# Patient Record
Sex: Female | Born: 1990 | Race: Black or African American | Hispanic: No | Marital: Single | Smoking: Never smoker
Health system: Southern US, Community
[De-identification: ages and names within clinical notes are randomized; demographics above are authoritative.]

## PROBLEM LIST (undated history)

## (undated) DIAGNOSIS — Z789 Other specified health status: Secondary | ICD-10-CM

---
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
## 2010-04-20 ENCOUNTER — Inpatient Hospital Stay (HOSPITAL_COMMUNITY): Admission: AD | Admit: 2010-04-20 | Discharge: 2010-04-24 | Payer: Self-pay | Admitting: Obstetrics and Gynecology

## 2010-04-21 ENCOUNTER — Encounter (INDEPENDENT_AMBULATORY_CARE_PROVIDER_SITE_OTHER): Payer: Self-pay | Admitting: Obstetrics and Gynecology

## 2010-11-22 LAB — CBC
HCT: 24.2 % — ABNORMAL LOW (ref 36.0–46.0)
MCH: 28.6 pg (ref 26.0–34.0)
MCHC: 34.4 g/dL (ref 30.0–36.0)
MCHC: 33.1 g/dL (ref 30.0–36.0)
MCV: 86 fL (ref 78.0–100.0)
Platelets: 189 10*3/uL (ref 150–400)
Platelets: 232 10*3/uL (ref 150–400)
RDW: 15.4 % (ref 11.5–15.5)

## 2010-11-22 LAB — RPR: RPR Ser Ql: NONREACTIVE

## 2010-11-27 LAB — URINALYSIS, ROUTINE W REFLEX MICROSCOPIC
Nitrite: NEGATIVE
Specific Gravity, Urine: >1.03 — ABNORMAL HIGH (ref 1.005–1.030)
Urobilinogen, UA: 0.2 mg/dL (ref 0.0–1.0)

## 2010-11-27 LAB — CBC
Platelets: 257 10*3/uL (ref 150–400)
RDW: 14.9 % (ref 11.5–15.5)

## 2014-03-20 ENCOUNTER — Encounter (HOSPITAL_COMMUNITY): Payer: Self-pay | Admitting: *Deleted

## 2014-03-20 ENCOUNTER — Inpatient Hospital Stay (HOSPITAL_COMMUNITY)
Admission: AD | Admit: 2014-03-20 | Discharge: 2014-03-20 | Disposition: A | Discharge status: Home / Self Care | Payer: Self-pay | Source: Ambulatory Visit | Attending: Obstetrics & Gynecology | Admitting: Obstetrics & Gynecology

## 2014-03-20 DIAGNOSIS — N909 Noninflammatory disorder of vulva and perineum, unspecified: Secondary | ICD-10-CM | POA: Insufficient documentation

## 2014-03-20 DIAGNOSIS — A5901 Trichomonal vulvovaginitis: Secondary | ICD-10-CM | POA: Insufficient documentation

## 2014-03-20 DIAGNOSIS — Z87891 Personal history of nicotine dependence: Secondary | ICD-10-CM | POA: Insufficient documentation

## 2014-03-20 DIAGNOSIS — Z202 Contact with and (suspected) exposure to infections with a predominantly sexual mode of transmission: Secondary | ICD-10-CM | POA: Insufficient documentation

## 2014-03-20 LAB — HERPES SIMPLEX VIRUS(HSV) DNA BY PCR
HSV 1 DNA: NOT DETECTED
HSV 2 DNA: DETECTED

## 2014-03-20 LAB — URINALYSIS, ROUTINE W REFLEX MICROSCOPIC
BILIRUBIN URINE: NEGATIVE
GLUCOSE, UA: NEGATIVE mg/dL
Hgb urine dipstick: NEGATIVE
KETONES UR: NEGATIVE mg/dL
NITRITE: NEGATIVE
PH: 6 (ref 5.0–8.0)
Protein, ur: NEGATIVE mg/dL
Urobilinogen, UA: 0.2 mg/dL (ref 0.0–1.0)

## 2014-03-20 LAB — URINE MICROSCOPIC-ADD ON

## 2014-03-20 LAB — WET PREP, GENITAL: YEAST WET PREP: NONE SEEN

## 2014-03-20 LAB — POCT PREGNANCY, URINE: Preg Test, Ur: NEGATIVE

## 2014-03-20 LAB — GC/CHLAMYDIA PROBE AMP
CT PROBE, AMP APTIMA: NEGATIVE
GC PROBE AMP APTIMA: NEGATIVE

## 2014-03-20 MED ORDER — METRONIDAZOLE 500 MG PO TABS
2000.0000 mg | ORAL_TABLET | Freq: Once | ORAL | Status: AC
  Administered 2014-03-20: 2000 mg via ORAL
  Filled 2014-03-20: qty 4

## 2014-03-20 MED ORDER — CEFTRIAXONE SODIUM 250 MG IJ SOLR
250.0000 mg | Freq: Once | INTRAMUSCULAR | Status: AC
  Administered 2014-03-20: 250 mg via INTRAMUSCULAR
  Filled 2014-03-20: qty 250

## 2014-03-20 MED ORDER — AZITHROMYCIN 250 MG PO TABS
1000.0000 mg | ORAL_TABLET | Freq: Once | ORAL | Status: AC
  Administered 2014-03-20: 1000 mg via ORAL
  Filled 2014-03-20: qty 4

## 2014-03-20 NOTE — Discharge Instructions (Signed)
Trichomoniasis Trichomoniasis is an infection caused by an organism called Trichomonas. The infection can affect both women and men. In women, the outer female genitalia and the vagina are affected. In men, the penis is mainly affected, but the prostate and other reproductive organs can also be involved. Trichomoniasis is a sexually transmitted infection (STI) and is most often passed to another person through sexual contact.  RISK FACTORS  Having unprotected sexual intercourse.  Having sexual intercourse with an infected partner. SIGNS AND SYMPTOMS  Symptoms of trichomoniasis in women include:  Abnormal gray-green frothy vaginal discharge.  Itching and irritation of the vagina.  Itching and irritation of the area outside the vagina. Symptoms of trichomoniasis in men include:   Penile discharge with or without pain.  Pain during urination. This results from inflammation of the urethra. DIAGNOSIS  Trichomoniasis may be found during a Pap test or physical exam. Your health care provider may use one of the following methods to help diagnose this infection:  Examining vaginal discharge under a microscope. For men, urethral discharge would be examined.  Testing the pH of the vagina with a test tape.  Using a vaginal swab test that checks for the Trichomonas organism. A test is available that provides results within a few minutes.  Doing a culture test for the organism. This is not usually needed. TREATMENT   You may be given medicine to fight the infection. Women should inform their health care provider if they could be or are pregnant. Some medicines used to treat the infection should not be taken during pregnancy.  Your health care provider may recommend over-the-counter medicines or creams to decrease itching or irritation.  Your sexual partner will need to be treated if infected. HOME CARE INSTRUCTIONS   Take all medicine prescribed by your health care provider.  Take  over-the-counter medicine for itching or irritation as directed by your health care provider.  Do not have sexual intercourse while you have the infection.  Women should not douche or wear tampons while they have the infection.  Discuss your infection with your partner. Your partner may have gotten the infection from you, or you may have gotten it from your partner.  Have your sex partner get examined and treated if necessary.  Practice safe, informed, and protected sex.  See your health care provider for other STI testing. SEEK MEDICAL CARE IF:   You still have symptoms after you finish your medicine.  You develop abdominal pain.  You have pain when you urinate.  You have bleeding after sexual intercourse.  You develop a rash.  Your medicine makes you sick or makes you throw up (vomit). Document Released: 02/18/2001 Document Revised: 08/30/2013 Document Reviewed: 06/06/2013 [facility] Patient Information 2015 [place], [place]. This information is not intended to replace advice given to you by your health care provider. Make sure you discuss any questions you have with your health care provider.  Herpes Simplex Herpes simplex is generally classified as Type 1 or Type 2. Type 1 is generally the type that is responsible for cold sores. Type 2 is generally associated with sexually transmitted diseases. We now know that most of the thoughts on these viruses are inaccurate. We find that HSV1 is also present genitally and HSV2 can be present orally, but this will vary in different locations of the world. Herpes simplex is usually detected by doing a culture. Blood tests are also available for this virus; however, the accuracy is often not as good.  PREPARATION FOR TEST No  preparation or fasting is necessary. NORMAL FINDINGS  No virus present  No HSV antigens or antibodies present Ranges for normal findings may vary among different laboratories and hospitals. You should always check with  your doctor after having lab work or other tests done to discuss the meaning of your test results and whether your values are considered within normal limits. MEANING OF TEST  Your caregiver will go over the test results with you and discuss the importance and meaning of your results, as well as treatment options and the need for additional tests if necessary. OBTAINING THE TEST RESULTS  It is your responsibility to obtain your test results. Ask the lab or department performing the test when and how you will get your results. Document Released: 09/27/2004 Document Revised: 11/17/2011 Document Reviewed: 08/05/2008 [facility] Patient Information 2015 [place], [place]. This information is not intended to replace advice given to you by your health care provider. Make sure you discuss any questions you have with your health care provider.

## 2014-03-20 NOTE — MAU Note (Signed)
Pt c/o vag swelling and sore bumps on labia for the past 1-2 weeks; also reports yellow-green discharge for the past two weeks. Pt says she was hoping it  Would go away, that's why she waited til now.

## 2014-03-20 NOTE — MAU Provider Note (Signed)
History     CSN: [id number]  Arrival date and time: 03/20/14 0045   None     Chief Complaint  Patient presents with  . Groin Swelling   HPI  Karen Blevins is a 23 y.o. G1P1001 who presents today with sores on her labia, and a green discharge she has had both for about 2 weeks. She states that she has had pain, but it has gotten better.   History reviewed. No pertinent past medical history.  History reviewed. No pertinent past surgical history.  No family history on file.  History  Substance Use Topics  . Smoking status: Not on file  . Smokeless tobacco: Not on file  . Alcohol Use: Not on file    Allergies: Allergies not on file  No prescriptions prior to admission    ROS Physical Exam   Blood pressure 104/60, pulse 82, temperature 98.2 F (36.8 C), temperature source Oral, resp. rate 18, height 5' (1.524 m), weight 52.731 kg (116 lb 4 oz), SpO2 100.00%.  Physical Exam  Nursing note and vitals reviewed. Constitutional: She is oriented to person, place, and time. She appears well-developed and well-nourished. No distress.  Cardiovascular: Normal rate.   Respiratory: Effort normal.  GI: Soft. There is no tenderness. There is no rebound.  Genitourinary:   External: 3 small herpatic appearing lesions. Two that have healed over at this time, and one that has not fully crusted over. Attempted to obtain a HSV culture.  Vagina: moderate amount of thick green, malodorous discharge Cervix: pink, smooth, no CMT Uterus: NSSC Adnexa: NT   Neurological: She is alert and oriented to person, place, and time.  Skin: Skin is warm and dry.  Psychiatric: She has a normal mood and affect.    MAU Course  Procedures  Results for orders placed during the hospital encounter of 03/20/14 (from the past 24 hour(s))  URINALYSIS, ROUTINE W REFLEX MICROSCOPIC     Status: Abnormal   Collection Time    03/20/14 12:55 AM      Result Value Ref Range   Color, Urine YELLOW  YELLOW   APPearance CLEAR  CLEAR   Specific Gravity, Urine >1.030 (*) 1.005 - 1.030   pH 6.0  5.0 - 8.0   Glucose, UA NEGATIVE  NEGATIVE mg/dL   Hgb urine dipstick NEGATIVE  NEGATIVE   Bilirubin Urine NEGATIVE  NEGATIVE   Ketones, ur NEGATIVE  NEGATIVE mg/dL   Protein, ur NEGATIVE  NEGATIVE mg/dL   Urobilinogen, UA 0.2  0.0 - 1.0 mg/dL   Nitrite NEGATIVE  NEGATIVE   Leukocytes, UA SMALL (*) NEGATIVE  URINE MICROSCOPIC-ADD ON     Status: Abnormal   Collection Time    03/20/14 12:55 AM      Result Value Ref Range   Squamous Epithelial / LPF FEW (*) RARE   WBC, UA 7-10  <3 WBC/hpf   RBC / HPF 3-6  <3 RBC/hpf   Bacteria, UA FEW (*) RARE   Crystals CA OXALATE CRYSTALS (*) NEGATIVE   Urine-Other MUCOUS PRESENT    POCT PREGNANCY, URINE     Status: None   Collection Time    03/20/14  1:03 AM      Result Value Ref Range   Preg Test, Ur NEGATIVE  NEGATIVE  WET PREP, GENITAL     Status: Abnormal   Collection Time    03/20/14  1:45 AM      Result Value Ref Range   Yeast Wet Prep HPF POC NONE  SEEN  NONE SEEN   Trich, Wet Prep FEW (*) NONE SEEN   Clue Cells Wet Prep HPF POC FEW (*) NONE SEEN   WBC, Wet Prep HPF POC MODERATE (*) NONE SEEN   0209: D/W the patient, and she desires treatment today for GC/CT and trichomoniasis   Assessment and Plan   1. Trichomonal vaginitis   2. Exposure to STD    Treated here today for Trichomoniasis and GC/CT Discussed partner treatment HSV PCR pending Discussed comfort measures Return to MAU as needed  Follow-up Information   Follow up with [facility] [place] [facility]ERN[facility] ADMISS[facility]. (As needed)    Contact information:   [address] [id number] [place] [address] [phone]       Tawnya Crook 03/20/2014, 1:49 AM

## 2014-03-22 ENCOUNTER — Telehealth: Payer: Self-pay

## 2014-03-22 MED ORDER — METRONIDAZOLE 500 MG PO TABS: 2000.0000 mg | ORAL_TABLET | Freq: Once | ORAL | Status: DC

## 2014-03-22 MED ORDER — ACYCLOVIR 200 MG PO CAPS: 400.0000 mg | ORAL_CAPSULE | Freq: Three times a day (TID) | ORAL | Status: DC

## 2014-03-22 NOTE — Telephone Encounter (Signed)
Pt called wanting STD results.  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  I educated pt on the herpes and pt choose to have continuous medication management.  Pt informed me that her pharmacy would like to have [facility] off [address].  I advised pt that because she does not have insurance it would be cheaper.  Received orders from Para MarchViriginia Smith, CNM for acyclovir 400 mg po bid for continuous management and then 400 mg tid for 7 days for her current outbreak.  I also informed pt that there will also be a flagyl at her pharmacy for the tx of trich.  I advised pt to have her partner(s) tx'd as well.  Pt stated understanding. [facility]ovir.

## 2014-07-10 ENCOUNTER — Encounter (HOSPITAL_COMMUNITY): Payer: Self-pay | Admitting: *Deleted

## 2014-09-08 NOTE — L&D Delivery Note (Signed)
Delivery Note At 8:51 PM a viable female was delivered via VBAC, Spontaneous (Presentation: Right Occiput Anterior).  APGAR: 9, 9; weight pending.   Placenta status: Intact, Spontaneous.  Cord:  with the following complications: None.  Anesthesia: Epidural  Episiotomy: None Lacerations: 1st degree;Labial Suture Repair: 3.0 vicryl Est. Blood Loss (mL): 100  Mom to postpartum.  Baby to Couplet care / Skin to Skin.  Karen Blevins D 08/29/2015, 9:11 PM

## 2014-10-02 ENCOUNTER — Encounter: Payer: Self-pay | Admitting: Family Medicine

## 2015-01-24 ENCOUNTER — Emergency Department (INDEPENDENT_AMBULATORY_CARE_PROVIDER_SITE_OTHER)
Admission: EM | Admit: 2015-01-24 | Discharge: 2015-01-24 | Disposition: A | Discharge status: Home / Self Care | Payer: Self-pay | Source: Home / Self Care | Attending: Family Medicine | Admitting: Family Medicine

## 2015-01-24 ENCOUNTER — Encounter (HOSPITAL_COMMUNITY): Payer: Self-pay

## 2015-01-24 DIAGNOSIS — M778 Other enthesopathies, not elsewhere classified: Secondary | ICD-10-CM

## 2015-01-24 MED ORDER — IBUPROFEN 800 MG PO TABS
800.0000 mg | ORAL_TABLET | Freq: Once | ORAL | Status: AC
  Administered 2015-01-24: 800 mg via ORAL

## 2015-01-24 MED ORDER — NAPROXEN 375 MG PO TABS: 375.0000 mg | ORAL_TABLET | Freq: Two times a day (BID) | ORAL | Status: DC

## 2015-01-24 MED ORDER — IBUPROFEN 800 MG PO TABS
ORAL_TABLET | ORAL | Status: AC
Start: 2015-01-24 — End: 2015-01-24
  Filled 2015-01-24: qty 1

## 2015-01-24 NOTE — ED Notes (Signed)
C/o pain in left wrist ( non-dominant) x 2 days. 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

## 2015-01-24 NOTE — Discharge Instructions (Signed)
Ice, splint and medication as directed. Do not sleep in splint  Repetitive Strain Injuries Repetitive strain injuries (RSIs) result from overuse or misuse of soft tissues including muscles, tendons, or nerves. Tendons are the cord-like structures that attach muscles to bones. RSIs can affect almost any part of the body. However, RSIs are most common in the arms (thumbs, wrists, elbows, shoulders) and legs (ankles, knees). Common medical conditions that are often caused by repetitive strain include carpal tunnel syndrome, tennis or golfer's elbow, bursitis, and tendonitis. If RSIs are treated early, and therepeated activity is reduced or removed, the severity and length of your problems can usually be reduced. RSIs are also called cumulative trauma disorders (CTD).  CAUSES  Many RSIs occur due to repeating the same activity at work over weeks or months without sufficient rest, such as prolonged typing. RSIs also commonly occur when a hobby or sport is done repeatedly without sufficient rest. RSIs can also occur due to repeated strain or stress on a body part in someone who has one or more risk factors for RSIs. RISK FACTORS Workplace risk factors  Frequent computer use, especially if your workstation is not adjusted for your body type.  Infrequent rest breaks.  Working in a high-pressure environment.  Working at a [employer] Corporationfast pace.  Repeating the same motion, such as frequent typing.  Working in an awkward position or holding the same position for a long time.  Forceful movements such as lifting, pulling, or pushing.  Vibration caused by using power tools.  Working in cold temperatures.  Job stress. Personal risk factors  Poor posture.  Being loose-jointed.  Not exercising regularly.  Being overweight.  Arthritis, diabetes, thyroid problems, or other long-term (chronic)medical conditions.  Vitamin deficiencies.  Keeping your fingernails long.  An unhealthy, stressful, or  inactive lifestyle.  Not sleeping well. SYMPTOMS  Symptoms often begin at work but become more noticeable after the repeated stress has ended. For example, you may develop fatigue or soreness in your wrist while typingat work, and at night you may develop numbness and tingling in your fingers. Common symptoms include:   Burning, shooting, or aching pain, especially in the fingers, palms, wrists, forearms, or shoulders.  Tenderness.  Swelling.  Tingling, numbness, or loss of feeling.  Pain with certain activities, such as turning a doorknob or reaching above your head.  Weakness, heaviness, or loss of coordination in yourhand.  Muscle spasms or tightness. In some cases, symptoms can become so intense that it is difficult to perform everyday tasks. Symptoms that do not improve with rest may indicate a more serious condition.  DIAGNOSIS  Your caregiver may determine the type ofRSI you have based on your medical evaluation and a description of your activities.  TREATMENT  Treatment depends on the severity and type of RSI you have. Your caregiver may recommend rest for the affected body part, medicines, and physical or occupational therapy to reduce pain, swelling, and soreness. Discuss the activities you do repeatedly with your caregiver. Your caregiver can help you decide whether you need to change your activities. An RSI may take months or years to heal, especially if the affected body part gets insufficient rest. In some cases, such as severe carpal tunnel syndrome, surgery may be recommended. PREVENTION  Talk with your supervisor to make sure you have the proper equipment for your work station.  Maintain good posture at your desk or work station with:  Feet flat on the floor.  Knees directly over the feet, bent at  a right angle.  Lower back supported by your chair or a cushion in the curve of your lower back.  Shoulders and arms relaxed and at your sides.  Neck relaxed and  not bent forwards or backwards.  Your desk and computer workstation properly adjusted to your body type.  Your chair adjusted so there is no excess pressure on the back of your thighs.  The keyboard resting above your thighs. You should be able to reach the keys with your elbows at your side, bent at a right angle. Your arms should be supported on forearm rests, with your forearms parallel to the ground.  The computer mouse within easy reach.  The monitor directly in front of you, so that your eyes are aligned with the top of the screen. The screen should be about 15 to 25 inches from your eyes.  While typing, keep your wrist straight, in a neutral position. Move your entire arm when you move your mouse or when typing hard-to-reach keys.  Only use your computer as much as you need to for work. Do not use it during breaks.  Take breaks often from any repeated activity. Alternate with another task which requires you to use different muscles, or rest at least once every hour.  Change positions regularly. If you spend a lot of time sitting, get up, walk around, and stretch.  Do not hold pens or pencils tightly when writing.  Exercise regularly.  Maintain a normal weight.  Eat a diet with plenty of vegetables, whole grains, and fruit.  Get sufficient, restful sleep. HOME CARE INSTRUCTIONS  If your caregiver prescribed medicine to help reduce swelling, take it as directed.  Only take over-the-counter or prescription medicines for pain, discomfort, or fever as directed by your caregiver.  Reduce, and if needed, stopthe activities that are causing your problems until you have no further symptoms.If your symptoms are work-related, you may need to talk to your supervisor about changing your activities.  When symptoms develop, put ice or a cold pack on the aching area.  Put ice in a plastic bag.  Place a towel between your skin and the bag.  Leave the ice on for 15-20 minutes.  If  you were given a splint to keep your wrist from bending, wear it as instructed. It is important to wear the splint at night. Use the splint for as long as your caregiver recommends. SEEK MEDICAL CARE IF:  You develop new problems.  Your problems do not get better with medicine. MAKE SURE YOU:  Understand these instructions.  Will watch your condition.  Will get help right away if you are not doing well or get worse. Document Released: 08/15/2002 Document Revised: 02/24/2012 Document Reviewed: 10/16/2011 [facility] Patient Information 2015 [place], [place]. This information is not intended to replace advice given to you by your health care provider. Make sure you discuss any questions you have with your health care provider.  Tendinitis Tendinitis is swelling and inflammation of the tendons. Tendons are band-like tissues that connect muscle to bone. Tendinitis commonly occurs in the:   Shoulders (rotator cuff).  Heels (Achilles tendon).  Elbows (triceps tendon). CAUSES Tendinitis is usually caused by overusing the tendon, muscles, and joints involved. When the tissue surrounding a tendon (synovium) becomes inflamed, it is called tenosynovitis. Tendinitis commonly develops in people whose jobs require repetitive motions. SYMPTOMS  Pain.  Tenderness.  Mild swelling. DIAGNOSIS Tendinitis is usually diagnosed by physical exam. Your health care provider may also order X-rays  or other imaging tests. TREATMENT Your health care provider may recommend certain medicines or exercises for your treatment. HOME CARE INSTRUCTIONS   Use a sling or splint for as long as directed by your health care provider until the pain decreases.  Put ice on the injured area.  Put ice in a plastic bag.  Place a towel between your skin and the bag.  Leave the ice on for 15-20 minutes, 3-4 times a day, or as directed by your health care provider.  Avoid using the limb while the tendon is painful. Perform  gentle range of motion exercises only as directed by your health care provider. Stop exercises if pain or discomfort increase, unless directed otherwise by your health care provider.  Only take over-the-counter or prescription medicines for pain, discomfort, or fever as directed by your health care provider. SEEK MEDICAL CARE IF:   Your pain and swelling increase.  You develop new, unexplained symptoms, especially increased numbness in the hands. MAKE SURE YOU:   Understand these instructions.  Will watch your condition.  Will get help right away if you are not doing well or get worse. Document Released: 08/22/2000 Document Revised: 01/09/2014 Document Reviewed: 11/11/2010 [facility] Patient Information 2015 [place], [place]. This information is not intended to replace advice given to you by your health care provider. Make sure you discuss any questions you have with your health care provider.

## 2015-01-24 NOTE — ED Provider Notes (Signed)
CSN: [id number]     Arrival date & time 01/24/15  1929 History   First MD Initiated Contact with Patient 01/24/15 2016     Chief Complaint  Patient presents with  . 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 female presenting with wrist pain. The history is provided by the patient.  Wrist Pain This is a new problem. The current episode started 2 days ago. The problem occurs constantly. The problem has not changed since onset.   History reviewed. No pertinent past medical history. History reviewed. No pertinent past surgical history. History reviewed. No pertinent family history. History  Substance Use Topics  . Smoking status: Never Smoker   . Smokeless tobacco: Not on file  . Alcohol Use: No   OB History    Gravida Para Term Preterm AB TAB SAB Ectopic Multiple Living   1 1 1       1      Review of Systems  All other systems reviewed and are negative.   Allergies  Review of patient's allergies indicates no known allergies.  Home Medications   Prior to Admission medications   Medication Sig Start Date End Date Taking? Authorizing Provider  acyclovir (ZOVIRAX) 200 MG capsule Take 2 capsules (400 mg total) by mouth 3 (three) times daily. 03/22/14   Dorathy KinsmanVirginia Smith, CNM  metroNIDAZOLE (FLAGYL) 500 MG tablet Take 4 tablets (2,000 mg total) by mouth once. 03/22/14   Dorathy KinsmanVirginia Smith, CNM  naproxen (NAPROSYN) 375 MG tablet Take 1 tablet (375 mg total) by mouth 2 (two) times daily. As needed for pain 01/24/15   Jess BartersJennifer Lee H Annaliese Saez, PA   BP 108/71 mmHg  Pulse 69  Temp(Src) 98.5 F (36.9 C) (Oral)  Resp 18  SpO2 100%  LMP 12/27/2014 (Exact Date) Physical Exam  Constitutional: She is oriented to person, place, and time. She appears well-developed  and well-nourished. No distress.  Cardiovascular: Normal rate.   Pulmonary/Chest: Effort normal.  Musculoskeletal: Normal range of motion.       Left wrist: She exhibits tenderness. She exhibits normal range of motion, no bony tenderness, no swelling, no effusion, no crepitus, no deformity and no laceration.       Arms: Neurological: She is alert and oriented to person, place, and time.  Skin: Skin is warm and dry.  Psychiatric: Her behavior is normal.  Nursing note and vitals reviewed.   ED Course  Procedures (including critical care time) Labs Review Labs Reviewed - No data to display  Imaging Review No results found.   MDM   1. Left wrist tendonitis    Velcro splint as needed for comfort during the day RICE, NSAIDs Follow up prn    Ria ClockJennifer Lee H Kryslyn Helbig, PA 01/24/15 2039

## 2015-06-13 ENCOUNTER — Ambulatory Visit: Payer: Self-pay | Admitting: Obstetrics and Gynecology

## 2015-06-21 LAB — OB RESULTS CONSOLE HIV ANTIBODY (ROUTINE TESTING): HIV: NONREACTIVE

## 2015-06-21 LAB — OB RESULTS CONSOLE HEPATITIS B SURFACE ANTIGEN: HEP B S AG: NEGATIVE

## 2015-06-21 LAB — OB RESULTS CONSOLE RUBELLA ANTIBODY, IGM: Rubella: IMMUNE

## 2015-08-08 LAB — OB RESULTS CONSOLE GBS: STREP GROUP B AG: NEGATIVE

## 2015-08-08 LAB — OB RESULTS CONSOLE GC/CHLAMYDIA
CHLAMYDIA, DNA PROBE: NEGATIVE
GC PROBE AMP, GENITAL: NEGATIVE

## 2015-08-26 ENCOUNTER — Encounter (HOSPITAL_COMMUNITY): Payer: Self-pay | Admitting: *Deleted

## 2015-08-26 ENCOUNTER — Inpatient Hospital Stay (HOSPITAL_COMMUNITY)
Admission: AD | Admit: 2015-08-26 | Discharge: 2015-08-27 | Disposition: A | Discharge status: Home / Self Care | Payer: Medicaid Other | Source: Ambulatory Visit | Attending: Obstetrics and Gynecology | Admitting: Obstetrics and Gynecology

## 2015-08-26 DIAGNOSIS — Z3493 Encounter for supervision of normal pregnancy, unspecified, third trimester: Secondary | ICD-10-CM | POA: Insufficient documentation

## 2015-08-26 DIAGNOSIS — O34219 Maternal care for unspecified type scar from previous cesarean delivery: Secondary | ICD-10-CM

## 2015-08-26 NOTE — MAU Note (Signed)
Pt reports period-like cramping off/on for weeks, worsening today. Some pink on tissue when she wipes (yesterday).

## 2015-08-27 ENCOUNTER — Inpatient Hospital Stay (HOSPITAL_COMMUNITY): Payer: Medicaid Other

## 2015-08-27 DIAGNOSIS — Z3493 Encounter for supervision of normal pregnancy, unspecified, third trimester: Secondary | ICD-10-CM | POA: Diagnosis present

## 2015-08-27 NOTE — Discharge Instructions (Signed)
Braxton Hicks Contractions °Contractions of the uterus can occur throughout pregnancy. Contractions are not always a sign that you are in labor.  °WHAT ARE BRAXTON HICKS CONTRACTIONS?  °Contractions that occur before labor are called Braxton Hicks contractions, or false labor. Toward the end of pregnancy (32-34 weeks), these contractions can develop more often and may become more forceful. This is not true labor because these contractions do not result in opening (dilatation) and thinning of the cervix. They are sometimes difficult to tell apart from true labor because these contractions can be forceful and people have different pain tolerances. You should not feel embarrassed if you go to the hospital with false labor. Sometimes, the only way to tell if you are in true labor is for your health care provider to look for changes in the cervix. °If there are no prenatal problems or other health problems associated with the pregnancy, it is completely safe to be sent home with false labor and await the onset of true labor. °HOW CAN YOU TELL THE DIFFERENCE BETWEEN TRUE AND FALSE LABOR? °False Labor °· The contractions of false labor are usually shorter and not as hard as those of true labor.   °· The contractions are usually irregular.   °· The contractions are often felt in the front of the lower abdomen and in the groin.   °· The contractions may go away when you walk around or change positions while lying down.   °· The contractions get weaker and are shorter lasting as time goes on.   °· The contractions do not usually become progressively stronger, regular, and closer together as with true labor.   °True Labor °· Contractions in true labor last 30-70 seconds, become very regular, usually become more intense, and increase in frequency.   °· The contractions do not go away with walking.   °· The discomfort is usually felt in the top of the uterus and spreads to the lower abdomen and low back.   °· True labor can be  determined by your health care provider with an exam. This will show that the cervix is dilating and getting thinner.   °WHAT TO REMEMBER °· Keep up with your usual exercises and follow other instructions given by your health care provider.   °· Take medicines as directed by your health care provider.   °· Keep your regular prenatal appointments.   °· Eat and drink lightly if you think you are going into labor.   °· If Braxton Hicks contractions are making you uncomfortable:   °¨ Change your position from lying down or resting to walking, or from walking to resting.   °¨ Sit and rest in a tub of warm water.   °¨ Drink 2-3 glasses of water. Dehydration may cause these contractions.   °¨ Do slow and deep breathing several times an hour.   °WHEN SHOULD I SEEK IMMEDIATE MEDICAL CARE? °Seek immediate medical care if: °· Your contractions become stronger, more regular, and closer together.   °· You have fluid leaking or gushing from your vagina.   °· You have a fever.   °· You pass blood-tinged mucus.   °· You have vaginal bleeding.   °· You have continuous abdominal pain.   °· You have low back pain that you never had before.   °· You feel your baby's head pushing down and causing pelvic pressure.   °· Your baby is not moving as much as it used to.   °  °This information is not intended to replace advice given to you by your health care provider. Make sure you discuss any questions you have with your health care   provider. °  °Document Released: 08/25/2005 Document Revised: 08/30/2013 Document Reviewed: 06/06/2013 °Elsevier Interactive Patient Education ©2016 [employer] ° °

## 2015-08-29 ENCOUNTER — Inpatient Hospital Stay (HOSPITAL_COMMUNITY): Payer: Medicaid Other | Admitting: Anesthesiology

## 2015-08-29 ENCOUNTER — Encounter (HOSPITAL_COMMUNITY): Payer: Self-pay | Admitting: *Deleted

## 2015-08-29 ENCOUNTER — Inpatient Hospital Stay (HOSPITAL_COMMUNITY)
Admission: AD | Admit: 2015-08-29 | Discharge: 2015-08-31 | DRG: 775 | Disposition: A | Discharge status: Home / Self Care | Payer: Medicaid Other | Source: Ambulatory Visit | Attending: Obstetrics and Gynecology | Admitting: Obstetrics and Gynecology

## 2015-08-29 DIAGNOSIS — Z3A38 38 weeks gestation of pregnancy: Secondary | ICD-10-CM

## 2015-08-29 DIAGNOSIS — O9081 Anemia of the puerperium: Secondary | ICD-10-CM | POA: Diagnosis present

## 2015-08-29 DIAGNOSIS — D649 Anemia, unspecified: Secondary | ICD-10-CM | POA: Diagnosis present

## 2015-08-29 DIAGNOSIS — O34211 Maternal care for low transverse scar from previous cesarean delivery: Secondary | ICD-10-CM | POA: Diagnosis present

## 2015-08-29 DIAGNOSIS — O34219 Maternal care for unspecified type scar from previous cesarean delivery: Secondary | ICD-10-CM | POA: Diagnosis not present

## 2015-08-29 DIAGNOSIS — IMO0001 Reserved for inherently not codable concepts without codable children: Secondary | ICD-10-CM

## 2015-08-29 HISTORY — DX: Other specified health status: Z78.9

## 2015-08-29 LAB — CBC
HCT: 30.4 % — ABNORMAL LOW (ref 36.0–46.0)
Hemoglobin: 9.8 g/dL — ABNORMAL LOW (ref 12.0–15.0)
MCH: 25.9 pg — ABNORMAL LOW (ref 26.0–34.0)
MCHC: 32.2 g/dL (ref 30.0–36.0)
MCV: 80.2 fL (ref 78.0–100.0)
PLATELETS: 217 10*3/uL (ref 150–400)
RBC: 3.79 MIL/uL — ABNORMAL LOW (ref 3.87–5.11)
RDW: 16.2 % — AB (ref 11.5–15.5)
WBC: 11.6 10*3/uL — AB (ref 4.0–10.5)

## 2015-08-29 LAB — TYPE AND SCREEN
ABO/RH(D): O POS
Antibody Screen: NEGATIVE

## 2015-08-29 LAB — ABO/RH: ABO/RH(D): O POS

## 2015-08-29 MED ORDER — METHYLERGONOVINE MALEATE 0.2 MG PO TABS: 0.2000 mg | ORAL_TABLET | ORAL | Status: DC | PRN

## 2015-08-29 MED ORDER — LIDOCAINE HCL (PF) 1 % IJ SOLN
INTRAMUSCULAR | Status: DC | PRN
  Administered 2015-08-29 (×2): 4 mL

## 2015-08-29 MED ORDER — BUTORPHANOL TARTRATE 1 MG/ML IJ SOLN
1.0000 mg | INTRAMUSCULAR | Status: DC | PRN
  Administered 2015-08-29: 1 mg via INTRAVENOUS
  Filled 2015-08-29: qty 1

## 2015-08-29 MED ORDER — CITRIC ACID-SODIUM CITRATE 334-500 MG/5ML PO SOLN: 30.0000 mL | ORAL | Status: DC | PRN

## 2015-08-29 MED ORDER — LACTATED RINGERS IV SOLN
INTRAVENOUS | Status: DC
  Administered 2015-08-29: 12:00:00 via INTRAVENOUS

## 2015-08-29 MED ORDER — DIPHENHYDRAMINE HCL 50 MG/ML IJ SOLN: 12.5000 mg | INTRAMUSCULAR | Status: DC | PRN

## 2015-08-29 MED ORDER — WITCH HAZEL-GLYCERIN EX PADS: 1.0000 "application " | MEDICATED_PAD | CUTANEOUS | Status: DC | PRN

## 2015-08-29 MED ORDER — EPHEDRINE 5 MG/ML INJ
10.0000 mg | INTRAVENOUS | Status: DC | PRN
  Filled 2015-08-29: qty 2

## 2015-08-29 MED ORDER — LACTATED RINGERS IV SOLN
500.0000 mL | INTRAVENOUS | Status: DC | PRN
  Administered 2015-08-29 (×2): 500 mL via INTRAVENOUS

## 2015-08-29 MED ORDER — PHENYLEPHRINE 40 MCG/ML (10ML) SYRINGE FOR IV PUSH (FOR BLOOD PRESSURE SUPPORT)
80.0000 ug | PREFILLED_SYRINGE | INTRAVENOUS | Status: DC | PRN
  Filled 2015-08-29: qty 2
  Filled 2015-08-29 (×2): qty 20

## 2015-08-29 MED ORDER — TETANUS-DIPHTH-ACELL PERTUSSIS 5-2.5-18.5 LF-MCG/0.5 IM SUSP: 0.5000 mL | Freq: Once | INTRAMUSCULAR | Status: DC

## 2015-08-29 MED ORDER — OXYCODONE-ACETAMINOPHEN 5-325 MG PO TABS
2.0000 | ORAL_TABLET | ORAL | Status: DC | PRN
Start: 2015-08-29 — End: 2015-08-29

## 2015-08-29 MED ORDER — SENNOSIDES-DOCUSATE SODIUM 8.6-50 MG PO TABS
2.0000 | ORAL_TABLET | ORAL | Status: DC
Start: 2015-08-30 — End: 2015-08-31
  Administered 2015-08-30 – 2015-08-31 (×2): 2 via ORAL
  Filled 2015-08-29 (×2): qty 2

## 2015-08-29 MED ORDER — ONDANSETRON HCL 4 MG/2ML IJ SOLN: 4.0000 mg | Freq: Four times a day (QID) | INTRAMUSCULAR | Status: DC | PRN

## 2015-08-29 MED ORDER — LIDOCAINE HCL (PF) 1 % IJ SOLN
30.0000 mL | INTRAMUSCULAR | Status: DC | PRN
Start: 2015-08-29 — End: 2015-08-29
  Filled 2015-08-29: qty 30

## 2015-08-29 MED ORDER — DIPHENHYDRAMINE HCL 25 MG PO CAPS: 25.0000 mg | ORAL_CAPSULE | Freq: Four times a day (QID) | ORAL | Status: DC | PRN

## 2015-08-29 MED ORDER — IBUPROFEN 600 MG PO TABS
600.0000 mg | ORAL_TABLET | Freq: Four times a day (QID) | ORAL | Status: DC
  Administered 2015-08-30 – 2015-08-31 (×7): 600 mg via ORAL
  Filled 2015-08-29 (×7): qty 1

## 2015-08-29 MED ORDER — LANOLIN HYDROUS EX OINT: TOPICAL_OINTMENT | CUTANEOUS | Status: DC | PRN

## 2015-08-29 MED ORDER — BENZOCAINE-MENTHOL 20-0.5 % EX AERO
1.0000 "application " | INHALATION_SPRAY | CUTANEOUS | Status: DC | PRN
  Administered 2015-08-31: 1 via TOPICAL
  Filled 2015-08-29: qty 56

## 2015-08-29 MED ORDER — ONDANSETRON HCL 4 MG PO TABS: 4.0000 mg | ORAL_TABLET | ORAL | Status: DC | PRN

## 2015-08-29 MED ORDER — DIBUCAINE 1 % RE OINT: 1.0000 "application " | TOPICAL_OINTMENT | RECTAL | Status: DC | PRN

## 2015-08-29 MED ORDER — ACETAMINOPHEN 325 MG PO TABS
650.0000 mg | ORAL_TABLET | ORAL | Status: DC | PRN
  Administered 2015-08-29: 650 mg via ORAL
  Filled 2015-08-29: qty 2

## 2015-08-29 MED ORDER — FENTANYL 2.5 MCG/ML BUPIVACAINE 1/10 % EPIDURAL INFUSION (WH - ANES)
14.0000 mL/h | INTRAMUSCULAR | Status: DC | PRN
  Administered 2015-08-29: 12.5 mL/h via EPIDURAL
  Filled 2015-08-29 (×2): qty 125

## 2015-08-29 MED ORDER — OXYCODONE-ACETAMINOPHEN 5-325 MG PO TABS: 1.0000 | ORAL_TABLET | ORAL | Status: DC | PRN

## 2015-08-29 MED ORDER — ONDANSETRON HCL 4 MG/2ML IJ SOLN: 4.0000 mg | INTRAMUSCULAR | Status: DC | PRN

## 2015-08-29 MED ORDER — OXYTOCIN 40 UNITS IN LACTATED RINGERS INFUSION - SIMPLE MED
62.5000 mL/h | INTRAVENOUS | Status: DC
  Filled 2015-08-29: qty 1000

## 2015-08-29 MED ORDER — SIMETHICONE 80 MG PO CHEW: 80.0000 mg | CHEWABLE_TABLET | ORAL | Status: DC | PRN

## 2015-08-29 MED ORDER — METHYLERGONOVINE MALEATE 0.2 MG/ML IJ SOLN: 0.2000 mg | INTRAMUSCULAR | Status: DC | PRN

## 2015-08-29 MED ORDER — MAGNESIUM HYDROXIDE 400 MG/5ML PO SUSP: 30.0000 mL | ORAL | Status: DC | PRN

## 2015-08-29 MED ORDER — OXYTOCIN BOLUS FROM INFUSION
500.0000 mL | INTRAVENOUS | Status: DC
  Administered 2015-08-29: 500 mL via INTRAVENOUS

## 2015-08-29 MED ORDER — OXYCODONE-ACETAMINOPHEN 5-325 MG PO TABS
1.0000 | ORAL_TABLET | ORAL | Status: DC | PRN
  Administered 2015-08-30: 1 via ORAL
  Filled 2015-08-29: qty 1

## 2015-08-29 MED ORDER — ZOLPIDEM TARTRATE 5 MG PO TABS: 5.0000 mg | ORAL_TABLET | Freq: Every evening | ORAL | Status: DC | PRN

## 2015-08-29 MED ORDER — PRENATAL MULTIVITAMIN CH
1.0000 | ORAL_TABLET | Freq: Every day | ORAL | Status: DC
  Administered 2015-08-30 – 2015-08-31 (×2): 1 via ORAL
  Filled 2015-08-29 (×2): qty 1

## 2015-08-29 MED ORDER — MEASLES, MUMPS & RUBELLA VAC ~~LOC~~ INJ
0.5000 mL | INJECTION | Freq: Once | SUBCUTANEOUS | Status: DC
  Filled 2015-08-29: qty 0.5

## 2015-08-29 NOTE — Anesthesia Procedure Notes (Signed)
Epidural Patient location during procedure: OB  Staffing Anesthesiologist: Liron Eissler Performed by: anesthesiologist   Preanesthetic Checklist Completed: patient identified, site marked, surgical consent, pre-op evaluation, timeout performed, IV checked, risks and benefits discussed and monitors and equipment checked  Epidural Patient position: sitting Prep: site prepped and draped and DuraPrep Patient monitoring: continuous pulse ox and blood pressure Approach: midline Location: L3-L4 Injection technique: LOR saline  Needle:  Needle type: Tuohy  Needle gauge: 17 G Needle length: 9 cm and 9 Needle insertion depth: 6 cm Catheter type: closed end flexible Catheter size: 19 Gauge Catheter at skin depth: 12 cm Test dose: negative  Assessment Events: blood not aspirated, injection not painful, no injection resistance, negative IV test and no paresthesia  Additional Notes Patient identified. Risks/Benefits/Options discussed with patient including but not limited to bleeding, infection, nerve damage, paralysis, failed block, incomplete pain control, headache, blood pressure changes, nausea, vomiting, reactions to medication both or allergic, itching and postpartum back pain. Confirmed with bedside nurse the patient's most recent platelet count. Confirmed with patient that they are not currently taking any anticoagulation, have any bleeding history or any family history of bleeding disorders. Patient expressed understanding and wished to proceed. All questions were answered. Sterile technique was used throughout the entire procedure. Please see nursing notes for vital signs. Test dose was given through epidural catheter and negative prior to continuing to dose epidural or start infusion. Warning signs of high block given to the patient including shortness of breath, tingling/numbness in hands, complete motor block, or any concerning symptoms with instructions to call for help. Patient was  given instructions on fall risk and not to get out of bed. All questions and concerns addressed with instructions to call with any issues or inadequate analgesia.

## 2015-08-29 NOTE — Progress Notes (Signed)
Comfortable with epidural Afeb, VSS FHT- Cat II, mod variability, + scalp stim, variable decels, ctx q 2-3 min VE-C/C/+2 Will start pushing, anticipate VBAC

## 2015-08-29 NOTE — MAU Note (Signed)
Pt presents to MAU with complaints of contractions that started a couple of days ago. Reports bloody show with yellow discharge

## 2015-08-29 NOTE — Progress Notes (Signed)
Comfortable with epidural Afeb, VSS FHT- Cat II, mod variability, baseline around 150, + scalp stim, has had some late decels, some variable decels and a recent prolonged decel VE- 6/70/-1, vtx, AROM clear, IUPC and FSE inserted Now in active labor, starting to make more progress.  AROM done for augmentation and to place internal monitors.  Will continue to monitor progress and FHT.

## 2015-08-29 NOTE — H&P (Signed)
Alexiya L Loleta ChanceHill is a 24 y.o. female, G3 P1011, EGA 38+ weeks presenting for ctx.  On initial eval in MAU, VE 2-3 cm with irreg ctx.  Cervix has changed to 3 cm and now 4 cm dilated, also with bloody show.  Prenatal care complicated by previous LTCS, CTOL and desires VBAC.  Maternal Medical History:  Reason for admission: Contractions.   Contractions: Frequency: irregular.   Perceived severity is moderate.    Fetal activity: Perceived fetal activity is normal.      OB History    Gravida Para Term Preterm AB TAB SAB Ectopic Multiple Living   2 1 1       1      History reviewed. No pertinent past medical history. History reviewed. No pertinent past surgical history. Family History: family history is not on file. Social History:  reports that she has never smoked. She does not have any smokeless tobacco history on file. She reports that she does not drink alcohol or use illicit drugs.   Prenatal Transfer Tool  Maternal Diabetes: No Genetic Screening: Declined Maternal Ultrasounds/Referrals: Normal Fetal Ultrasounds or other Referrals:  None Maternal Substance Abuse:  No Significant Maternal Medications:  None Significant Maternal Lab Results:  Lab values include: Group B Strep negative Other Comments:  None  Review of Systems  Respiratory: Negative.   Cardiovascular: Negative.     Dilation: 4 Effacement (%): 70 Station: -3, -2 Exam by:: Dr Jackelyn KnifeMeisinger Blood pressure 102/81, pulse 90, temperature 98.5 F (36.9 C), resp. rate 18, last menstrual period 12/27/2014. Maternal Exam:  Uterine Assessment: Contraction strength is moderate.  Contraction frequency is irregular.   Abdomen: Patient reports no abdominal tenderness. Surgical scars: low transverse.   Estimated fetal weight is 7 1/2 lbs.   Fetal presentation: vertex  Introitus: Normal vulva. Normal vagina.  Amniotic fluid character: not assessed.  Pelvis: adequate for delivery.   Cervix: Cervix evaluated by digital exam.      Fetal Exam Fetal Monitor Review: Variability: moderate (6-25 bpm).   Pattern: accelerations present, early decelerations, variable decelerations and late decelerations.   Intermittent variable, early, and late decels  Fetal State Assessment: Category II - tracings are indeterminate.     Physical Exam  Prenatal labs: ABO, Rh: --/--/O POS, O POS (12/21 1125) Antibody: NEG (12/21 1125) Rubella:  Immune RPR:   NR HBsAg:   Neg HIV:   NR GBS:   Neg  Assessment/Plan: IUP at 38+ weeks in early labor, previous LTCS for VBAC.  L&D is full, waiting for a room.  Will monitor progress and FHT.   Lacoya Wilbanks D 08/29/2015, 2:03 PM

## 2015-08-29 NOTE — Anesthesia Preprocedure Evaluation (Signed)
Anesthesia Evaluation  Patient identified by MRN, date of birth, ID band Patient awake    Reviewed: Allergy & Precautions, NPO status , Patient's Chart, lab work & pertinent test results  History of Anesthesia Complications Negative for: history of anesthetic complications  Airway Mallampati: II  TM Distance: >3 FB Neck ROM: Full    Dental no notable dental hx. (+) Dental Advisory Given   Pulmonary neg pulmonary ROS,    Pulmonary exam normal breath sounds clear to auscultation       Cardiovascular negative cardio ROS Normal cardiovascular exam Rhythm:Regular Rate:Normal     Neuro/Psych negative neurological ROS  negative psych ROS   GI/Hepatic negative GI ROS, Neg liver ROS,   Endo/Other  obesity  Renal/GU negative Renal ROS  negative genitourinary   Musculoskeletal negative musculoskeletal ROS (+)   Abdominal   Peds negative pediatric ROS (+)  Hematology negative hematology ROS (+)   Anesthesia Other Findings   Reproductive/Obstetrics (+) Pregnancy TOLAC                             Anesthesia Physical Anesthesia Plan  ASA: II  Anesthesia Plan: Epidural   Post-op Pain Management:    Induction:   Airway Management Planned:   Additional Equipment:   Intra-op Plan:   Post-operative Plan:   Informed Consent: I have reviewed the patients History and Physical, chart, labs and discussed the procedure including the risks, benefits and alternatives for the proposed anesthesia with the patient or authorized representative who has indicated his/her understanding and acceptance.   Dental advisory given  Plan Discussed with: CRNA  Anesthesia Plan Comments:         Anesthesia Quick Evaluation

## 2015-08-29 NOTE — Progress Notes (Signed)
Dr Jackelyn KnifeMeisinger notified of pt's VE, FHR tracing, orders received after he reviewed trracing

## 2015-08-30 LAB — CBC
HCT: 27.3 % — ABNORMAL LOW (ref 36.0–46.0)
HEMOGLOBIN: 8.9 g/dL — AB (ref 12.0–15.0)
MCH: 26.3 pg (ref 26.0–34.0)
MCHC: 32.6 g/dL (ref 30.0–36.0)
MCV: 80.5 fL (ref 78.0–100.0)
PLATELETS: 187 10*3/uL (ref 150–400)
RBC: 3.39 MIL/uL — AB (ref 3.87–5.11)
RDW: 16.4 % — ABNORMAL HIGH (ref 11.5–15.5)
WBC: 20.7 10*3/uL — ABNORMAL HIGH (ref 4.0–10.5)

## 2015-08-30 LAB — RPR: RPR: NONREACTIVE

## 2015-08-30 NOTE — Anesthesia Postprocedure Evaluation (Signed)
Anesthesia Post Note  Patient: Karen Blevins  Procedure(s) Performed: * No procedures listed *  Patient location during evaluation: Mother Baby Anesthesia Type: Epidural Level of consciousness: awake Pain management: pain level controlled Vital Signs Assessment: post-procedure vital signs reviewed and stable Respiratory status: spontaneous breathing Cardiovascular status: stable Postop Assessment: no headache, no backache, epidural receding, patient able to bend at knees, no signs of nausea or vomiting and adequate PO intake Anesthetic complications: no    Last Vitals:  Filed Vitals:   08/30/15 0100 08/30/15 0500  BP: 116/63 114/68  Pulse: 111 65  Temp: 37.6 C 36.6 C  Resp: 20 18    Last Pain:  Filed Vitals:   08/30/15 0535  PainSc: 0-No pain                 Doc Mandala

## 2015-08-30 NOTE — Progress Notes (Addendum)
Post Partum Day #1 VBAC Subjective: no complaints and tolerating PO  Objective: Blood pressure 114/68, pulse 65, temperature 97.9 F (36.6 C), temperature source Oral, resp. rate 18, height 5' (1.524 m), weight 75.297 kg (166 lb), last menstrual period 12/27/2014, SpO2 100 %, unknown if currently breastfeeding.  Physical Exam:  General: alert Lochia: appropriate Uterine Fundus: firm   Recent Labs  08/29/15 1125 08/30/15 0555  HGB 9.8* 8.9*  HCT 30.4* 27.3*    Assessment/Plan: Plan for discharge tomorrow, had temp to 100.9 right after delivery and felt warm at delivery-afebrile since then, continue routine care, will do circumcision in the office   LOS: 1 day   Traci Gafford D 08/30/2015, 8:35 AM

## 2015-08-30 NOTE — Lactation Note (Signed)
This note was copied from the chart of Karen Blevins. Lactation Consultation Note  Patient Name: Karen Beryle QuantFreddi Sobalvarro ZOXWR'UToday's Date: 08/30/2015 Reason for consult: Initial assessment;Infant < 6lbs  Visited with Mom, baby 15 hrs old.  This is Mom's 2nd baby, but first to BF.  Baby's temp borderline low (98.7ax), so unwrapped and undressed baby to put him skin to skin in football hold. Demonstrated manual breast expression, colostrum easy to express. Assisted with latch and baby able to latch deeply, after a couple attempts.  Teaching done during assist.  Baby sucked and swallowed well throughout the 10 minute feeding.  Encouraged Mom to place baby skin to skin and watch for feeding cues.  Basic teaching on depth of latch discussed.  GMOB is very supportive and very aware of importance of positioning and latching well.  Talked about importance of awakening baby every 3 hrs for feedings, and feeding more often if he cues.   Brochure left in room.  Informed Mom of IP and OP lactation services available to her.  Encouraged her to call prn for assistance with latch.  RN aware of findings, and Mom's need for support and guidance.  Mom complaining of sore neck.    Maternal Data Formula Feeding for Exclusion: No Has patient been taught Hand Expression?: Yes Does the patient have breastfeeding experience prior to this delivery?: No  Feeding Feeding Type: Breast Fed Length of feed: 10 min  LATCH Score/Interventions Latch: Grasps breast easily, tongue down, lips flanged, rhythmical sucking. Intervention(s): Skin to skin;Teach feeding cues;Waking techniques Intervention(s): Breast compression;Breast massage;Assist with latch;Adjust position  Audible Swallowing: Spontaneous and intermittent  Type of Nipple: Everted at rest and after stimulation  Comfort (Breast/Nipple): Soft / non-tender     Hold (Positioning): Assistance needed to correctly position infant at breast and maintain  latch. Intervention(s): Breastfeeding basics reviewed;Support Pillows;Position options;Skin to skin  LATCH Score: 9  Lactation Tools Discussed/Used WIC Program: Yes   Consult Status Consult Status: Follow-up Date: 08/31/15 Follow-up type: In-patient    Judee ClaraSmith, Django Nguyen E 08/30/2015, 12:21 PM

## 2015-08-31 MED ORDER — IBUPROFEN 600 MG PO TABS: 600.0000 mg | ORAL_TABLET | Freq: Four times a day (QID) | ORAL | Status: DC

## 2015-08-31 NOTE — Lactation Note (Signed)
This note was copied from the chart of Karen Blevins. Lactation Consultation Note  Patient Name: Karen Beryle QuantFreddi Cutshaw JYNWG'NToday's Date: 08/31/2015 Reason for consult: Follow-up assessment   With this mom of a 5 pound term baby, now 7339 hours old, and going home today. Mom loaned a DEP since she is active with WIC, and has until 09/12/15 to return the pump. Mom was able to express 20 ml's of colostrum, which she fed to her baby. Mom knows to call for o/p lactation appointment as needed.    Maternal Data    Feeding    LATCH Score/Interventions                      Lactation Tools Discussed/Used WIC Program: Yes (fax sent for DEP) Pump Review: Setup, frequency, and cleaning;Milk Storage;Other (comment) (initiation setting, hand expression) Initiated by:: Melenie Minniear rn ibclc   Consult Status Consult Status: Complete Date: 09/01/15 Follow-up type: Call as needed    [name], Hanan Moen Anne 08/31/2015, 12:19 PM

## 2015-08-31 NOTE — Discharge Summary (Signed)
OB Discharge Summary     Patient Name: Karen Blevins DOB: [birth year] MRN: [id number]  Date of admission: 08/29/2015 Delivering MD: Jackelyn Knife, TODD   Date of discharge: 08/31/2015  Admitting diagnosis: 38WKS,LEAKING,LABOR Intrauterine pregnancy: [redacted]w[redacted]d     Secondary diagnosis:  Active Problems:   Active labor at term   VBAC, delivered, current hospitalization  Additional problems: mild anemia     Discharge diagnosis: Term Pregnancy Delivered                                                                                                Post partum procedures:none  Augmentation: AROM  Complications: None  Hospital course:  Onset of Labor With Vaginal Delivery  (VBAC)   24 y.o. yo [id number] at [redacted]w[redacted]d was admitted in Active Laboron 08/29/2015. Patient had an uncomplicated labor course as follows:  Membrane Rupture Time/Date: 5:52 PM ,08/29/2015   Intrapartum Procedures: Episiotomy: None [1]                                         Lacerations:  1st degree [2];Labial [10]  Patient had a delivery of a Viable infant. 08/29/2015  Information for the patient's newborn:  Rosalie, [place] [[id number]]  Delivery Method: VBAC, Spontaneous (Filed from Delivery Summary)    Pateint had an uncomplicated postpartum course.  She is ambulating, tolerating a regular diet, passing flatus, and urinating well. Patient is discharged home in stable condition on No discharge date for patient encounter.Marland Kitchen    Physical exam  Filed Vitals:   08/30/15 0500 08/30/15 1402 08/30/15 1739 08/31/15 0621  BP: 114/68 107/66 115/76 119/67  Pulse: 65 87 84 66  Temp: 97.9 F (36.6 C) 98.2 F (36.8 C) 97.4 F (36.3 C) 98 F (36.7 C)  TempSrc: Oral Oral Oral Oral  Resp: Height:      Weight:      SpO2:  100% 100%    General: alert, cooperative and no distress Lochia: appropriate Uterine Fundus: firm DVT Evaluation: No evidence of DVT seen on physical exam. Labs: Lab Results  Component Value  Date   WBC 20.7* 08/30/2015   HGB 8.9* 08/30/2015   HCT 27.3* 08/30/2015   MCV 80.5 08/30/2015   PLT 187 08/30/2015   No flowsheet data found.  Discharge instruction: per After Visit Summary and "Baby and Me Booklet".  After visit meds:    Medication List    TAKE these medications        ibuprofen 600 MG tablet  Commonly known as:  ADVIL,MOTRIN  Take 1 tablet (600 mg total) by mouth every 6 (six) hours.     prenatal multivitamin Tabs tablet  Take 1 tablet by mouth daily at 12 noon.        Diet: routine diet  Activity: Advance as tolerated. Pelvic rest for 6 weeks.   Outpatient follow up:6 weeks Follow up Appt:No future appointments. Follow up Visit:No Follow-up on file.  Postpartum contraception: Nexplanon  Newborn Data: Live born  female  Birth Weight: 5 lb 3.6 oz (2370 g) APGAR: 9, 9  Baby Feeding: Bottle and Breast Disposition:home with mother  Baby boy ELI   08/31/2015 Edwinna Areolaecilia Worema Banga, DO

## 2015-08-31 NOTE — Discharge Instructions (Signed)
Nothing in vagina for 6 weeks.  No sex, tampons, and douching.  Other instructions as in [facility] Discharge Booklet. °

## 2015-08-31 NOTE — Lactation Note (Signed)
This note was copied from the chart of Karen Blevins. Lactation Consultation Note  Patient Name: Karen Beryle QuantFreddi Mcgurk ZOXWR'UToday's Date: 08/31/2015 Reason for consult: Follow-up assessment   With this mom of a term but small baby, now jsut 5 lbs 0.3 ounces, and baby was at 4% weight loss at 25 hours of age. Mom is breast geding and supplementing with formula. I set up a DEP for mom, and she was able to express  About 6-8 ml's of colostrum to feed the baby. i advised mom to pump if baby does not latch, and to feed EBM prior to formula. Mom has paper work for a [employer] loan[employer] DEP, and [facility]. Mom advised to also make an o/p lactation appoptment, if discharged to home today.    Maternal Data    Feeding Feeding Type: Bottle Fed - Formula Nipple Type: Slow - flow  LATCH Score/Interventions                      Lactation Tools Discussed/Used WIC Program: Yes (fax sent for DEP) Pump Review: Setup, frequency, and cleaning;Milk Storage;Other (comment) (initiation setting, hand expression) Initiated by:: [name] rn ibclc   Consult Status Consult Status: Follow-up Date: 09/01/15 Follow-up type: In-patient    [name], [name] 08/31/2015, 10:20 AM

## 2015-08-31 NOTE — Progress Notes (Signed)
Patient ID: Karen Blevins, female   DOB: 08-12-1991, 24 y.o.   MRN: [id number] Pt s/e. Feels well just sore. Pain controlled with ibuprofen. No fever/chills. Breast/bottlefeeding well and bonding with baby. Ready for discharge home today. No complaints. Desires nexplanon for bc

## 2015-09-11 ENCOUNTER — Encounter (HOSPITAL_COMMUNITY): Admission: RE | Payer: Self-pay | Source: Ambulatory Visit

## 2015-09-11 ENCOUNTER — Inpatient Hospital Stay (HOSPITAL_COMMUNITY)
Admission: RE | Admit: 2015-09-11 | Payer: Medicaid Other | Source: Ambulatory Visit | Admitting: Obstetrics and Gynecology

## 2015-09-11 SURGERY — Surgical Case
Anesthesia: Regional

## 2017-10-22 ENCOUNTER — Emergency Department (HOSPITAL_COMMUNITY): Payer: No Typology Code available for payment source

## 2017-10-22 ENCOUNTER — Emergency Department (HOSPITAL_COMMUNITY)
Admission: EM | Admit: 2017-10-22 | Discharge: 2017-10-22 | Disposition: A | Discharge status: Home / Self Care | Payer: No Typology Code available for payment source | Attending: Emergency Medicine | Admitting: Emergency Medicine

## 2017-10-22 ENCOUNTER — Encounter (HOSPITAL_COMMUNITY): Payer: Self-pay | Admitting: Emergency Medicine

## 2017-10-22 DIAGNOSIS — Z041 Encounter for examination and observation following transport accident: Secondary | ICD-10-CM | POA: Diagnosis present

## 2017-10-22 MED ORDER — IBUPROFEN 800 MG PO TABS: 800.0000 mg | ORAL_TABLET | Freq: Three times a day (TID) | ORAL | 0 refills | Status: DC

## 2017-10-22 MED ORDER — METHOCARBAMOL 500 MG PO TABS: 500.0000 mg | ORAL_TABLET | Freq: Every evening | ORAL | 0 refills | Status: DC | PRN

## 2017-10-22 MED ORDER — IBUPROFEN 800 MG PO TABS
800.0000 mg | ORAL_TABLET | Freq: Once | ORAL | Status: DC
  Filled 2017-10-22: qty 1

## 2017-10-22 MED ORDER — IBUPROFEN 800 MG PO TABS
800.0000 mg | ORAL_TABLET | Freq: Once | ORAL | Status: AC
  Administered 2017-10-22: 800 mg via ORAL
  Filled 2017-10-22: qty 1
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
## 2017-10-22 NOTE — ED Notes (Signed)
Discussed meds and time for taking. OK for home.

## 2017-10-22 NOTE — ED Notes (Signed)
Patient dropped 800mg  ibuprofen on floor.
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
## 2017-10-25 ENCOUNTER — Emergency Department (HOSPITAL_COMMUNITY)
Admission: EM | Admit: 2017-10-25 | Discharge: 2017-10-25 | Disposition: A | Discharge status: Home / Self Care | Payer: No Typology Code available for payment source | Attending: Emergency Medicine | Admitting: Emergency Medicine

## 2017-10-25 ENCOUNTER — Encounter (HOSPITAL_COMMUNITY): Payer: Self-pay | Admitting: Emergency Medicine

## 2017-10-25 DIAGNOSIS — M549 Dorsalgia, unspecified: Secondary | ICD-10-CM | POA: Insufficient documentation

## 2017-10-25 MED ORDER — KETOROLAC TROMETHAMINE 30 MG/ML IJ SOLN
30.0000 mg | Freq: Once | INTRAMUSCULAR | Status: AC
  Administered 2017-10-25: 30 mg via INTRAMUSCULAR
  Filled 2017-10-25: qty 1

## 2017-10-25 MED ORDER — TRAMADOL HCL 50 MG PO TABS: 50.0000 mg | ORAL_TABLET | Freq: Four times a day (QID) | ORAL | 0 refills | Status: DC | PRN
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
## 2017-11-16 ENCOUNTER — Ambulatory Visit (INDEPENDENT_AMBULATORY_CARE_PROVIDER_SITE_OTHER): Payer: Self-pay | Admitting: Otolaryngology
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
## 2019-06-22 ENCOUNTER — Other Ambulatory Visit: Payer: Self-pay

## 2019-06-22 ENCOUNTER — Inpatient Hospital Stay (HOSPITAL_COMMUNITY)
Admission: AD | Admit: 2019-06-22 | Discharge: 2019-06-23 | Disposition: A | Discharge status: Home / Self Care | Payer: Self-pay | Attending: Obstetrics and Gynecology | Admitting: Obstetrics and Gynecology

## 2019-06-22 DIAGNOSIS — Z3A01 Less than 8 weeks gestation of pregnancy: Secondary | ICD-10-CM | POA: Insufficient documentation

## 2019-06-22 DIAGNOSIS — Z3490 Encounter for supervision of normal pregnancy, unspecified, unspecified trimester: Secondary | ICD-10-CM

## 2019-06-22 DIAGNOSIS — O219 Vomiting of pregnancy, unspecified: Secondary | ICD-10-CM | POA: Insufficient documentation

## 2019-06-22 NOTE — MAU Note (Signed)
Pt reports to MAU c/o vomiting x2 weeks and being unable to keep food down. Pt reports she thinks she is dehydrated. Pt reports she has been having breast tenderness. Pt denies vaginal bleeding or discharge. Pt denies abdominal pain. Pt has not taken a home test. LMP was September 2nd 2020. No other complaints at this time.

## 2019-06-23 ENCOUNTER — Encounter (HOSPITAL_COMMUNITY): Payer: Self-pay | Admitting: *Deleted

## 2019-06-23 DIAGNOSIS — Z3A01 Less than 8 weeks gestation of pregnancy: Secondary | ICD-10-CM

## 2019-06-23 DIAGNOSIS — O219 Vomiting of pregnancy, unspecified: Secondary | ICD-10-CM

## 2019-06-23 LAB — URINALYSIS, ROUTINE W REFLEX MICROSCOPIC
Bacteria, UA: NONE SEEN
Bilirubin Urine: NEGATIVE
Glucose, UA: NEGATIVE mg/dL
Hgb urine dipstick: NEGATIVE
Ketones, ur: 5 mg/dL — AB
Leukocytes,Ua: NEGATIVE
Nitrite: NEGATIVE
Protein, ur: 30 mg/dL — AB
Specific Gravity, Urine: 1.032 — ABNORMAL HIGH (ref 1.005–1.030)
pH: 5 (ref 5.0–8.0)

## 2019-06-23 LAB — POCT PREGNANCY, URINE: Preg Test, Ur: POSITIVE — AB

## 2019-06-23 MED ORDER — PROCHLORPERAZINE MALEATE 10 MG PO TABS: 10.0000 mg | ORAL_TABLET | Freq: Two times a day (BID) | ORAL | 0 refills | Status: DC | PRN

## 2019-06-23 NOTE — MAU Provider Note (Signed)
First Provider Initiated Contact with Patient 06/23/19 0037     S [name] is a 28 y.o. [phone] early pregnant female at [redacted]w[redacted]d by LMP who presents to MAU today with complaint of nausea and vomiting for 2 weeks and breast tenderness. Patient denies vaginal bleeding or abdominal pain, denies vaginal discharge. She believes she is pregnant and wants a pregnancy test to be sure- has not taken a test at home or in the office.    O BP 109/66 (BP Location: Right Arm)   Pulse 84   Temp 98.2 F (36.8 C) (Oral)   Resp 18   Wt 60.8 kg   LMP 05/11/2019 (Exact Date)   BMI 26.19 kg/m  Physical Exam  Nursing note and vitals reviewed. Constitutional: She is oriented to person, place, and time. She appears well-developed and well-nourished. No distress.  Cardiovascular: Normal rate and regular rhythm.  Respiratory: Effort normal and breath sounds normal. No respiratory distress. She has no wheezes.  GI: Soft. She exhibits no distension. There is no abdominal tenderness. There is no rebound.  Musculoskeletal: Normal range of motion.        General: No edema.  Neurological: She is alert and oriented to person, place, and time.  Skin: Skin is warm and dry.  Psychiatric: She has a normal mood and affect. Her behavior is normal. Thought content normal.   Results for orders placed or performed during the hospital encounter of 06/22/19 (from the past 24 hour(s))  Urinalysis, Routine w reflex microscopic     Status: Abnormal   Collection Time: 06/23/19 12:01 AM  Result Value Ref Range   Color, Urine AMBER (A) YELLOW   APPearance HAZY (A) CLEAR   Specific Gravity, Urine 1.032 (H) 1.005 - 1.030   pH 5.0 5.0 - 8.0   Glucose, UA NEGATIVE NEGATIVE mg/dL   Hgb urine dipstick NEGATIVE NEGATIVE   Bilirubin Urine NEGATIVE NEGATIVE   Ketones, ur 5 (A) NEGATIVE mg/dL   Protein, ur 30 (A) NEGATIVE mg/dL   Nitrite NEGATIVE NEGATIVE   Leukocytes,Ua NEGATIVE NEGATIVE   RBC / HPF 0-5 0 - 5 RBC/hpf   WBC,  UA 0-5 0 - 5 WBC/hpf   Bacteria, UA NONE SEEN NONE SEEN   Squamous Epithelial / LPF 0-5 0 - 5   Mucus PRESENT   Pregnancy, urine POC     Status: Abnormal   Collection Time: 06/23/19 12:10 AM  Result Value Ref Range   Preg Test, Ur POSITIVE (A) NEGATIVE   UPT positive  UA shows 5 ketones- no IV needed, patient reports being able to keep down crackers and juice at home prior to coming to MAU.   Patient reports that she has 2 kids at home and does not want a third. Adamant that she is planning on getting pregnancy terminated- "just needed to make sure I was pregnant".   Educated and discussed with patient it is her choice of whether she wants termination or to continue with pregnancy, encouraged patient to make decision prior to it being too late. Encouraged to schedule appointment with [facility] for prenatal care if she wants to continue pregnancy or make appointment with them for birth control options if she decides to terminate, patient verbalizes understanding.  Patient request Rx for N/V medication for the next 2-3 weeks while she is making her decision and so she can continue to work. Rx for compazine sent to pharmacy of choice that is 24 hours.   A Medical screening exam complete  1. Early stage of pregnancy   2. Nausea and vomiting during pregnancy prior to [redacted] weeks gestation     P Discharge from MAU in stable condition Rx for compazine sent to pharmacy of choice  Educated and discussed options  Follow up with [facility] Return to MAU as needed for emergencies    Sharyon Cable, CNM 06/23/2019 1:27 AM

## 2019-09-09 NOTE — L&D Delivery Note (Signed)
Operative Delivery Note I was CTSP for persistent FHR decels.  After she received epidural, there were persistent variable decels that did not resolve with position change.  When I arrived and checked her cervix, she was 8/80/0, IUPC and FSE placed and amnioinfusion begun.  FHR was improving, I had to step out for another delivery.  When I returned 15 minutes later, VE complete and she was instructed to push.  FHR had moderate variability, still some variable decels with ctx.  She pushed 4 times with good descent, FHR began having more prolonged decels.  NICU team called and vacuum applied for assistance.  3 pulls with next ctx affected delivery of vtx.  At 12:20 AM a viable female was delivered via Vaginal, Spontaneous.  Presentation: vertex; Position: Right,, Occiput,, Anterior; Station: +3.  Verbal consent: obtained from patient.  Risks and benefits discussed in detail.  Risks include, but are not limited to the risks of anesthesia, bleeding, infection, damage to maternal tissues, fetal cephalhematoma.  There is also the risk of inability to effect vaginal delivery of the head, or shoulder dystocia that cannot be resolved by established maneuvers, leading to the need for emergency cesarean section.  APGAR: 8, 9; weight pending.   Placenta status: spontaneous, intact.   Cord:  with the following complications: nuchal x 1 reduced.  Anesthesia:  Epidural Instruments: Kiwi Episiotomy: None Lacerations: left Labial Suture Repair: 3.0 vicryl rapide Est. Blood Loss (mL): 100  Mom to postpartum.  Baby to Couplet care / Skin to Skin.  She would like him circumcised.  Karen Blevins 08/24/2020, 12:39 AM

## 2020-02-22 ENCOUNTER — Encounter (HOSPITAL_COMMUNITY): Payer: Self-pay

## 2020-02-22 ENCOUNTER — Other Ambulatory Visit: Payer: Self-pay

## 2020-02-22 ENCOUNTER — Inpatient Hospital Stay (HOSPITAL_COMMUNITY)
Admission: AD | Admit: 2020-02-22 | Discharge: 2020-02-22 | Disposition: A | Discharge status: Home / Self Care | Payer: BC Managed Care – PPO | Attending: Family Medicine | Admitting: Family Medicine

## 2020-02-22 DIAGNOSIS — M545 Low back pain, unspecified: Secondary | ICD-10-CM

## 2020-02-22 DIAGNOSIS — R109 Unspecified abdominal pain: Secondary | ICD-10-CM | POA: Diagnosis not present

## 2020-02-22 DIAGNOSIS — O26891 Other specified pregnancy related conditions, first trimester: Secondary | ICD-10-CM

## 2020-02-22 DIAGNOSIS — M549 Dorsalgia, unspecified: Secondary | ICD-10-CM | POA: Insufficient documentation

## 2020-02-22 DIAGNOSIS — Z3A12 12 weeks gestation of pregnancy: Secondary | ICD-10-CM | POA: Insufficient documentation

## 2020-02-22 LAB — URINALYSIS, ROUTINE W REFLEX MICROSCOPIC
Bilirubin Urine: NEGATIVE
Glucose, UA: NEGATIVE mg/dL
Hgb urine dipstick: NEGATIVE
Ketones, ur: NEGATIVE mg/dL
Leukocytes,Ua: NEGATIVE
Nitrite: NEGATIVE
Protein, ur: NEGATIVE mg/dL
Specific Gravity, Urine: 1.026 (ref 1.005–1.030)
pH: 5 (ref 5.0–8.0)

## 2020-02-22 NOTE — MAU Provider Note (Signed)
Chief Complaint:  Possible Pregnancy   First Provider Initiated Contact with Patient 02/22/20 0029     HPI: Karen Blevins is a 29 y.o. [id number] at 45w0dwho presents to maternity admissions reporting intermittent sharp pains on sides of abdomen.  Not having any pain now.  Pains usually occur at work, thinks it may be related to bending and twisting at work. . She denies LOF, vaginal bleeding, vaginal itching/burning, urinary symptoms, h/a, dizziness, n/v, diarrhea, constipation or fever/chills.    Possible Pregnancy This is a new problem. The current episode started 1 to 4 weeks ago. The problem has been unchanged. Associated symptoms include abdominal pain (intermittent, none now). Pertinent negatives include no chills, congestion, fever, myalgias, nausea, visual change, vomiting or weakness. Nothing aggravates the symptoms. She has tried nothing for the symptoms.   RN Note: Pt here with reports of 2 positive pregnancy tests, one in April and one this month. LMP: around March. Pt denies bleeding, but has sharp pain on both sides and middle of stomach intermittently x2 weeks. No pain currently. Has not seen doctor yet.   Past Medical History: Past Medical History:  Diagnosis Date  . Medical history non-contributory     Past obstetric history: OB History  Gravida Para Term Preterm AB Living  5 2 2   1 2   SAB TAB Ectopic Multiple Live Births    1   0 2    # Outcome Date GA Lbr Len/2nd Weight Sex Delivery Anes PTL Lv  5 Current           4 Term 08/29/15 [redacted]w[redacted]d 17:04 / 00:47 2370 g M VBAC EPI  LIV  3 Term 04/21/10    M CS-LTranv  Y LIV  2 Gravida           1 TAB             Past Surgical History: Past Surgical History:  Procedure Laterality Date  . CESAREAN SECTION  2011    Family History: No family history on file.  Social History: Social History   Tobacco Use  . Smoking status: Never Smoker  . Smokeless tobacco: Never Used  Substance Use Topics  . Alcohol use: No  . Drug  use: No    Allergies: No Known Allergies  Meds:  Medications Prior to Admission  Medication Sig Dispense Refill Last Dose  . Prenatal Vit-Fe Fumarate-FA (PRENATAL MULTIVITAMIN) TABS tablet Take 1 tablet by mouth daily at 12 noon.     . prochlorperazine (COMPAZINE) 10 MG tablet Take 1 tablet (10 mg total) by mouth 2 (two) times daily as needed for nausea or vomiting. 20 tablet 0     I have reviewed patient's Past Medical Hx, Surgical Hx, Family Hx, Social Hx, medications and allergies.   ROS:  Review of Systems  Constitutional: Negative for chills and fever.  HENT: Negative for congestion.   Gastrointestinal: Positive for abdominal pain (intermittent, none now). Negative for nausea and vomiting.  Musculoskeletal: Negative for myalgias.  Neurological: Negative for weakness.   Other systems negative  Physical Exam   Patient Vitals for the past 24 hrs:  BP Temp Temp src Pulse Resp SpO2 Height Weight  02/22/20 0017 105/71 98.9 F (37.2 C) Oral 88 16 100 % 5' (1.524 m) 59.1 kg   Constitutional: Well-developed, well-nourished female in no acute distress.  Cardiovascular: normal rate and rhythm Respiratory: normal effort, clear to auscultation bilaterally GI: Abd soft, non-tender, gravid appropriate for gestational age.   No rebound  or guarding. MS: Extremities nontender, no edema, normal ROM Neurologic: Alert and oriented x 4.  GU: Neg CVAT.  PELVIC EXAM:  Deferred.  No pelvic pain  Labs: Results for orders placed or performed during the hospital encounter of 02/22/20 (from the past 24 hour(s))  Urinalysis, Routine w reflex microscopic     Status: None   Collection Time: 02/22/20 12:23 AM  Result Value Ref Range   Color, Urine YELLOW YELLOW   APPearance CLEAR CLEAR   Specific Gravity, Urine 1.026 1.005 - 1.030   pH 5.0 5.0 - 8.0   Glucose, UA NEGATIVE NEGATIVE mg/dL   Hgb urine dipstick NEGATIVE NEGATIVE   Bilirubin Urine NEGATIVE NEGATIVE   Ketones, ur NEGATIVE NEGATIVE  mg/dL   Protein, ur NEGATIVE NEGATIVE mg/dL   Nitrite NEGATIVE NEGATIVE   Leukocytes,Ua NEGATIVE NEGATIVE     Imaging:  Bedside US done Normal appearing gestational sac. Active single fetus seen HR 150s CRL measures [redacted]w[redacted]d  MAU Course/MDM: I have ordered labs and reviewed results. UA is clear, so doubt kidney stones or infection.  Abdominal exam is totally benign.  No tenderness or guarding.  Given nature of pain, suspect it may be lumbar strain from lifting and twisting at work. .   Offered additional workup with blood tests etc, she declines Discussed Dr Willis Modena may order a formal US when she presents for care  Assessment: Single IUP at [redacted]w[redacted]d Intermittent side/abdominal pain Intermittent back pain  Plan: Discharge home Preventative measures when heavy lifting Follow up in Office for prenatal visits and recheck of pain  Encouraged to return here or to other Urgent Care/ED if she develops worsening of symptoms, increase in pain, fever, or other concerning symptoms.   Pt stable at time of discharge.  Hansel Feinstein CNM, MSN Certified Nurse-Midwife 02/22/2020 12:29 AM

## 2020-02-22 NOTE — Discharge Instructions (Signed)
Abdominal Pain During Pregnancy  Abdominal pain is common during pregnancy, and has many possible causes. Some causes are more serious than others, and sometimes the cause is not known. Abdominal pain can be a sign that labor is starting. It can also be caused by normal growth and stretching of muscles and ligaments during pregnancy. Always tell your health care provider if you have any abdominal pain. Follow these instructions at home:  Do not have sex or put anything in your vagina until your pain goes away completely.  Get plenty of rest until your pain improves.  Drink enough fluid to keep your urine pale yellow.  Take over-the-counter and prescription medicines only as told by your health care provider.  Keep all follow-up visits as told by your health care provider. This is important. Contact a health care provider if:  Your pain continues or gets worse after resting.  You have lower abdominal pain that: ? Comes and goes at regular intervals. ? Spreads to your back. ? Is similar to menstrual cramps.  You have pain or burning when you urinate. Get help right away if:  You have a fever or chills.  You have vaginal bleeding.  You are leaking fluid from your vagina.  You are passing tissue from your vagina.  You have vomiting or diarrhea that lasts for more than 24 hours.  Your baby is moving less than usual.  You feel very weak or faint.  You have shortness of breath.  You develop severe pain in your upper abdomen. Summary  Abdominal pain is common during pregnancy, and has many possible causes.  If you experience abdominal pain during pregnancy, tell your health care provider right away.  Follow your health care provider's home care instructions and keep all follow-up visits as directed. This information is not intended to replace advice given to you by your health care provider. Make sure you discuss any questions you have with your health care  provider. Document Revised: 12/13/2018 Document Reviewed: 11/27/2016 Elsevier Patient Education  2020 [employer].  Second Trimester of Pregnancy The second trimester is from week 14 through week 27 (months 4 through 6). The second trimester is often a time when you feel your best. Your body has adjusted to being pregnant, and you begin to feel better physically. Usually, morning sickness has lessened or quit completely, you may have more energy, and you may have an increase in appetite. The second trimester is also a time when the fetus is growing rapidly. At the end of the sixth month, the fetus is about 9 inches long and weighs about 1 pounds. You will likely begin to feel the baby move (quickening) between 16 and 20 weeks of pregnancy. Body changes during your second trimester Your body continues to go through many changes during your second trimester. The changes vary from woman to woman.  Your weight will continue to increase. You will notice your lower abdomen bulging out.  You may begin to get stretch marks on your hips, abdomen, and breasts.  You may develop headaches that can be relieved by medicines. The medicines should be approved by your health care provider.  You may urinate more often because the fetus is pressing on your bladder.  You may develop or continue to have heartburn as a result of your pregnancy.  You may develop constipation because certain hormones are causing the muscles that push waste through your intestines to slow down.  You may develop hemorrhoids or swollen, bulging veins (varicose  veins).  You may have back pain. This is caused by: ? Weight gain. ? Pregnancy hormones that are relaxing the joints in your pelvis. ? A shift in weight and the muscles that support your balance.  Your breasts will continue to grow and they will continue to become tender.  Your gums may bleed and may be sensitive to brushing and flossing.  Dark spots or blotches  (chloasma, mask of pregnancy) may develop on your face. This will likely fade after the baby is born.  A dark line from your belly button to the pubic area (linea nigra) may appear. This will likely fade after the baby is born.  You may have changes in your hair. These can include thickening of your hair, rapid growth, and changes in texture. Some women also have hair loss during or after pregnancy, or hair that feels dry or thin. Your hair will most likely return to normal after your baby is born. What to expect at prenatal visits During a routine prenatal visit:  You will be weighed to make sure you and the fetus are growing normally.  Your blood pressure will be taken.  Your abdomen will be measured to track your baby's growth.  The fetal heartbeat will be listened to.  Any test results from the previous visit will be discussed. Your health care provider may ask you:  How you are feeling.  If you are feeling the baby move.  If you have had any abnormal symptoms, such as leaking fluid, bleeding, severe headaches, or abdominal cramping.  If you are using any tobacco products, including cigarettes, chewing tobacco, and electronic cigarettes.  If you have any questions. Other tests that may be performed during your second trimester include:  Blood tests that check for: ? Low iron levels (anemia). ? High blood sugar that affects pregnant women (gestational diabetes) between 96 and 28 weeks. ? Rh antibodies. This is to check for a protein on red blood cells (Rh factor).  Urine tests to check for infections, diabetes, or protein in the urine.  An ultrasound to confirm the proper growth and development of the baby.  An amniocentesis to check for possible genetic problems.  Fetal screens for spina bifida and Down syndrome.  HIV (human immunodeficiency virus) testing. Routine prenatal testing includes screening for HIV, unless you choose not to have this test. Follow these  instructions at home: Medicines  Follow your health care provider's instructions regarding medicine use. Specific medicines may be either safe or unsafe to take during pregnancy.  Take a prenatal vitamin that contains at least 600 micrograms (mcg) of folic acid.  If you develop constipation, try taking a stool softener if your health care provider approves. Eating and drinking   Eat a balanced diet that includes fresh fruits and vegetables, whole grains, good sources of protein such as meat, eggs, or tofu, and low-fat dairy. Your health care provider will help you determine the amount of weight gain that is right for you.  Avoid raw meat and uncooked cheese. These carry germs that can cause birth defects in the baby.  If you have low calcium intake from food, talk to your health care provider about whether you should take a daily calcium supplement.  Limit foods that are high in fat and processed sugars, such as fried and sweet foods.  To prevent constipation: ? Drink enough fluid to keep your urine clear or pale yellow. ? Eat foods that are high in fiber, such as fresh fruits and  vegetables, whole grains, and beans. Activity  Exercise only as directed by your health care provider. Most women can continue their usual exercise routine during pregnancy. Try to exercise for 30 minutes at least 5 days a week. Stop exercising if you experience uterine contractions.  Avoid heavy lifting, wear low heel shoes, and practice good posture.  A sexual relationship may be continued unless your health care provider directs you otherwise. Relieving pain and discomfort  Wear a good support bra to prevent discomfort from breast tenderness.  Take warm sitz baths to soothe any pain or discomfort caused by hemorrhoids. Use hemorrhoid cream if your health care provider approves.  Rest with your legs elevated if you have leg cramps or low back pain.  If you develop varicose veins, wear support hose.  Elevate your feet for 15 minutes, 3-4 times a day. Limit salt in your diet. Prenatal Care  Write down your questions. Take them to your prenatal visits.  Keep all your prenatal visits as told by your health care provider. This is important. Safety  Wear your seat belt at all times when driving.  Make a list of emergency phone numbers, including numbers for family, friends, the hospital, and police and fire departments. General instructions  Ask your health care provider for a referral to a local prenatal education class. Begin classes no later than the beginning of month 6 of your pregnancy.  Ask for help if you have counseling or nutritional needs during pregnancy. Your health care provider can offer advice or refer you to specialists for help with various needs.  Do not use hot tubs, steam rooms, or saunas.  Do not douche or use tampons or scented sanitary pads.  Do not cross your legs for long periods of time.  Avoid cat litter boxes and soil used by cats. These carry germs that can cause birth defects in the baby and possibly loss of the fetus by miscarriage or stillbirth.  Avoid all smoking, herbs, alcohol, and unprescribed drugs. Chemicals in these products can affect the formation and growth of the baby.  Do not use any products that contain nicotine or tobacco, such as cigarettes and e-cigarettes. If you need help quitting, ask your health care provider.  Visit your dentist if you have not gone yet during your pregnancy. Use a soft toothbrush to brush your teeth and be gentle when you floss. Contact a health care provider if:  You have dizziness.  You have mild pelvic cramps, pelvic pressure, or nagging pain in the abdominal area.  You have persistent nausea, vomiting, or diarrhea.  You have a bad smelling vaginal discharge.  You have pain when you urinate. Get help right away if:  You have a fever.  You are leaking fluid from your vagina.  You have spotting or  bleeding from your vagina.  You have severe abdominal cramping or pain.  You have rapid weight gain or weight loss.  You have shortness of breath with chest pain.  You notice sudden or extreme swelling of your face, hands, ankles, feet, or legs.  You have not felt your baby move in over an hour.  You have severe headaches that do not go away when you take medicine.  You have vision changes. Summary  The second trimester is from week 14 through week 27 (months 4 through 6). It is also a time when the fetus is growing rapidly.  Your body goes through many changes during pregnancy. The changes vary from woman to woman.  Avoid all smoking, herbs, alcohol, and unprescribed drugs. These chemicals affect the formation and growth your baby.  Do not use any tobacco products, such as cigarettes, chewing tobacco, and e-cigarettes. If you need help quitting, ask your health care provider.  Contact your health care provider if you have any questions. Keep all prenatal visits as told by your health care provider. This is important. This information is not intended to replace advice given to you by your health care provider. Make sure you discuss any questions you have with your health care provider. Document Revised: 12/17/2018 Document Reviewed: 09/30/2016 Elsevier Patient Education  [employer].

## 2020-02-22 NOTE — MAU Note (Addendum)
Pt here with reports of 2 positive pregnancy tests, one in April and one this month. LMP: around March. Pt denies bleeding, but has sharp pain on both sides and middle of stomach intermittently x2 weeks. No pain currently. Has not seen doctor yet.

## 2020-03-08 LAB — OB RESULTS CONSOLE GC/CHLAMYDIA
Chlamydia: NEGATIVE
Gonorrhea: NEGATIVE

## 2020-03-08 LAB — OB RESULTS CONSOLE RUBELLA ANTIBODY, IGM: Rubella: IMMUNE

## 2020-03-08 LAB — OB RESULTS CONSOLE HEPATITIS B SURFACE ANTIGEN: Hepatitis B Surface Ag: NEGATIVE

## 2020-05-29 ENCOUNTER — Other Ambulatory Visit: Payer: BC Managed Care – PPO

## 2020-06-06 LAB — OB RESULTS CONSOLE HIV ANTIBODY (ROUTINE TESTING): HIV: NONREACTIVE

## 2020-06-06 LAB — OB RESULTS CONSOLE RPR: RPR: NONREACTIVE

## 2020-08-07 LAB — OB RESULTS CONSOLE GBS: GBS: NEGATIVE

## 2020-08-19 ENCOUNTER — Encounter (HOSPITAL_COMMUNITY): Payer: Self-pay | Admitting: Obstetrics and Gynecology

## 2020-08-19 ENCOUNTER — Inpatient Hospital Stay (HOSPITAL_COMMUNITY)
Admission: AD | Admit: 2020-08-19 | Discharge: 2020-08-19 | Disposition: A | Discharge status: Home / Self Care | Payer: BC Managed Care – PPO | Attending: Obstetrics and Gynecology | Admitting: Obstetrics and Gynecology

## 2020-08-19 DIAGNOSIS — O471 False labor at or after 37 completed weeks of gestation: Secondary | ICD-10-CM

## 2020-08-19 DIAGNOSIS — Z3A38 38 weeks gestation of pregnancy: Secondary | ICD-10-CM | POA: Diagnosis not present

## 2020-08-19 DIAGNOSIS — O26893 Other specified pregnancy related conditions, third trimester: Secondary | ICD-10-CM | POA: Diagnosis present

## 2020-08-19 DIAGNOSIS — R102 Pelvic and perineal pain: Secondary | ICD-10-CM | POA: Diagnosis not present

## 2020-08-19 LAB — URINALYSIS, ROUTINE W REFLEX MICROSCOPIC
Bilirubin Urine: NEGATIVE
Glucose, UA: NEGATIVE mg/dL
Ketones, ur: 20 mg/dL — AB
Nitrite: NEGATIVE
Protein, ur: 30 mg/dL — AB
RBC / HPF: >50 RBC/hpf — ABNORMAL HIGH (ref 0–5)
Specific Gravity, Urine: 1.029 (ref 1.005–1.030)
pH: 5 (ref 5.0–8.0)

## 2020-08-19 MED ORDER — LACTATED RINGERS IV BOLUS
1000.0000 mL | Freq: Once | INTRAVENOUS | Status: AC
  Administered 2020-08-19: 1000 mL via INTRAVENOUS
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
## 2020-08-19 NOTE — Discharge Instructions (Signed)
Braxton Hicks Contractions °Contractions of the uterus can occur throughout pregnancy, but they are not always a sign that you are in labor. You may have practice contractions called Braxton Hicks contractions. These false labor contractions are sometimes confused with true labor. °What are Braxton Hicks contractions? °Braxton Hicks contractions are tightening movements that occur in the muscles of the uterus before labor. Unlike true labor contractions, these contractions do not result in opening (dilation) and thinning of the cervix. Toward the end of pregnancy (32-34 weeks), Braxton Hicks contractions can happen more often and may become stronger. These contractions are sometimes difficult to tell apart from true labor because they can be very uncomfortable. You should not feel embarrassed if you go to the hospital with false labor. °Sometimes, the only way to tell if you are in true labor is for your health care provider to look for changes in the cervix. The health care provider will do a physical exam and may monitor your contractions. If you are not in true labor, the exam should show that your cervix is not dilating and your water has not broken. °If there are no other health problems associated with your pregnancy, it is completely safe for you to be sent home with false labor. You may continue to have Braxton Hicks contractions until you go into true labor. °How to tell the difference between true labor and false labor °True labor °· Contractions last 30-70 seconds. °· Contractions become very regular. °· Discomfort is usually felt in the top of the uterus, and it spreads to the lower abdomen and low back. °· Contractions do not go away with walking. °· Contractions usually become more intense and increase in frequency. °· The cervix dilates and gets thinner. °False labor °· Contractions are usually shorter and not as strong as true labor contractions. °· Contractions are usually irregular. °· Contractions  are often felt in the front of the lower abdomen and in the groin. °· Contractions may go away when you walk around or change positions while lying down. °· Contractions get weaker and are shorter-lasting as time goes on. °· The cervix usually does not dilate or become thin. °Follow these instructions at home: ° °· Take over-the-counter and prescription medicines only as told by your health care provider. °· Keep up with your usual exercises and follow other instructions from your health care provider. °· Eat and drink lightly if you think you are going into labor. °· If Braxton Hicks contractions are making you uncomfortable: °? Change your position from lying down or resting to walking, or change from walking to resting. °? Sit and rest in a tub of warm water. °? Drink enough fluid to keep your urine pale yellow. Dehydration may cause these contractions. °? Do slow and deep breathing several times an hour. °· Keep all follow-up prenatal visits as told by your health care provider. This is important. °Contact a health care provider if: °· You have a fever. °· You have continuous pain in your abdomen. °Get help right away if: °· Your contractions become stronger, more regular, and closer together. °· You have fluid leaking or gushing from your vagina. °· You pass blood-tinged mucus (bloody show). °· You have bleeding from your vagina. °· You have low back pain that you never had before. °· You feel your baby’s head pushing down and causing pelvic pressure. °· Your baby is not moving inside you as much as it used to. °Summary °· Contractions that occur before labor are   called Braxton Hicks contractions, false labor, or practice contractions. °· Braxton Hicks contractions are usually shorter, weaker, farther apart, and less regular than true labor contractions. True labor contractions usually become progressively stronger and regular, and they become more frequent. °· Manage discomfort from Braxton Hicks contractions  by changing position, resting in a warm bath, drinking plenty of water, or practicing deep breathing. °This information is not intended to replace advice given to you by your health care provider. Make sure you discuss any questions you have with your health care provider. °Document Revised: 08/07/2017 Document Reviewed: 01/08/2017 °Elsevier Patient Education © 2020 [employer] ° °
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
## 2020-08-19 NOTE — MAU Note (Signed)
Pt having pelvic pain and pressure constant since yesterday, rates 10/10. No bleeding or LOF +FM. Feeling irregular ctx.

## 2020-08-22 ENCOUNTER — Telehealth (HOSPITAL_COMMUNITY): Payer: Self-pay | Admitting: *Deleted

## 2020-08-22 ENCOUNTER — Other Ambulatory Visit: Payer: Self-pay | Admitting: Obstetrics and Gynecology

## 2020-08-22 NOTE — Telephone Encounter (Signed)
Preadmission screen  

## 2020-08-23 ENCOUNTER — Inpatient Hospital Stay (HOSPITAL_COMMUNITY): Payer: BC Managed Care – PPO | Admitting: Anesthesiology

## 2020-08-23 ENCOUNTER — Inpatient Hospital Stay (HOSPITAL_COMMUNITY)
Admission: AD | Admit: 2020-08-23 | Discharge: 2020-08-25 | DRG: 807 | Disposition: A | Discharge status: Home / Self Care | Payer: BC Managed Care – PPO | Attending: Obstetrics and Gynecology | Admitting: Obstetrics and Gynecology

## 2020-08-23 ENCOUNTER — Inpatient Hospital Stay (HOSPITAL_COMMUNITY): Payer: BC Managed Care – PPO

## 2020-08-23 ENCOUNTER — Encounter (HOSPITAL_COMMUNITY): Payer: Self-pay | Admitting: Obstetrics and Gynecology

## 2020-08-23 ENCOUNTER — Other Ambulatory Visit: Payer: Self-pay

## 2020-08-23 DIAGNOSIS — Z20822 Contact with and (suspected) exposure to covid-19: Secondary | ICD-10-CM | POA: Diagnosis present

## 2020-08-23 DIAGNOSIS — O358XX0 Maternal care for other (suspected) fetal abnormality and damage, not applicable or unspecified: Secondary | ICD-10-CM | POA: Diagnosis present

## 2020-08-23 DIAGNOSIS — Z3A39 39 weeks gestation of pregnancy: Secondary | ICD-10-CM | POA: Diagnosis not present

## 2020-08-23 DIAGNOSIS — O34211 Maternal care for low transverse scar from previous cesarean delivery: Principal | ICD-10-CM | POA: Diagnosis present

## 2020-08-23 DIAGNOSIS — O34219 Maternal care for unspecified type scar from previous cesarean delivery: Secondary | ICD-10-CM

## 2020-08-23 LAB — CBC
HCT: 34.7 % — ABNORMAL LOW (ref 36.0–46.0)
Hemoglobin: 11.2 g/dL — ABNORMAL LOW (ref 12.0–15.0)
MCH: 28.6 pg (ref 26.0–34.0)
MCHC: 32.3 g/dL (ref 30.0–36.0)
MCV: 88.5 fL (ref 80.0–100.0)
Platelets: 229 10*3/uL (ref 150–400)
RBC: 3.92 MIL/uL (ref 3.87–5.11)
RDW: 14.5 % (ref 11.5–15.5)
WBC: 3.9 10*3/uL — ABNORMAL LOW (ref 4.0–10.5)
nRBC: 0 % (ref 0.0–0.2)

## 2020-08-23 LAB — RESP PANEL BY RT-PCR (FLU A&B, COVID) ARPGX2
Influenza A by PCR: NEGATIVE
Influenza B by PCR: NEGATIVE
SARS Coronavirus 2 by RT PCR: NEGATIVE

## 2020-08-23 LAB — TYPE AND SCREEN
ABO/RH(D): O POS
Antibody Screen: NEGATIVE

## 2020-08-23 MED ORDER — FENTANYL-BUPIVACAINE-NACL 0.5-0.125-0.9 MG/250ML-% EP SOLN
12.0000 mL/h | EPIDURAL | Status: DC | PRN
Start: 2020-08-23 — End: 2020-08-24
  Filled 2020-08-23: qty 250

## 2020-08-23 MED ORDER — TERBUTALINE SULFATE 1 MG/ML IJ SOLN: 0.2500 mg | Freq: Once | INTRAMUSCULAR | Status: DC | PRN

## 2020-08-23 MED ORDER — SODIUM CHLORIDE (PF) 0.9 % IJ SOLN
INTRAMUSCULAR | Status: DC | PRN
  Administered 2020-08-23: 12 mL/h via EPIDURAL

## 2020-08-23 MED ORDER — BUTORPHANOL TARTRATE 1 MG/ML IJ SOLN
1.0000 mg | INTRAMUSCULAR | Status: DC | PRN
  Administered 2020-08-23: 1 mg via INTRAVENOUS
  Filled 2020-08-23: qty 1

## 2020-08-23 MED ORDER — OXYCODONE-ACETAMINOPHEN 5-325 MG PO TABS
2.0000 | ORAL_TABLET | ORAL | Status: DC | PRN
Start: 2020-08-23 — End: 2020-08-24

## 2020-08-23 MED ORDER — PHENYLEPHRINE 40 MCG/ML (10ML) SYRINGE FOR IV PUSH (FOR BLOOD PRESSURE SUPPORT)
80.0000 ug | PREFILLED_SYRINGE | INTRAVENOUS | Status: DC | PRN
  Filled 2020-08-23: qty 10

## 2020-08-23 MED ORDER — SOD CITRATE-CITRIC ACID 500-334 MG/5ML PO SOLN
30.0000 mL | ORAL | Status: DC | PRN
  Filled 2020-08-23: qty 15

## 2020-08-23 MED ORDER — LIDOCAINE HCL (PF) 1 % IJ SOLN: 30.0000 mL | INTRAMUSCULAR | Status: DC | PRN

## 2020-08-23 MED ORDER — DIPHENHYDRAMINE HCL 50 MG/ML IJ SOLN: 12.5000 mg | INTRAMUSCULAR | Status: DC | PRN

## 2020-08-23 MED ORDER — OXYTOCIN-SODIUM CHLORIDE 30-0.9 UT/500ML-% IV SOLN
1.0000 m[IU]/min | INTRAVENOUS | Status: DC
  Administered 2020-08-23: 11:00:00 2 m[IU]/min via INTRAVENOUS
  Filled 2020-08-23: qty 500

## 2020-08-23 MED ORDER — OXYTOCIN-SODIUM CHLORIDE 30-0.9 UT/500ML-% IV SOLN: 2.5000 [IU]/h | INTRAVENOUS | Status: DC

## 2020-08-23 MED ORDER — EPHEDRINE 5 MG/ML INJ: 10.0000 mg | INTRAVENOUS | Status: DC | PRN

## 2020-08-23 MED ORDER — LIDOCAINE HCL (PF) 1 % IJ SOLN
INTRAMUSCULAR | Status: DC | PRN
  Administered 2020-08-23 (×2): 5 mL via EPIDURAL

## 2020-08-23 MED ORDER — OXYTOCIN BOLUS FROM INFUSION
333.0000 mL | Freq: Once | INTRAVENOUS | Status: AC
  Administered 2020-08-24: 333 mL via INTRAVENOUS

## 2020-08-23 MED ORDER — LACTATED RINGERS IV SOLN: INTRAVENOUS | Status: DC

## 2020-08-23 MED ORDER — LACTATED RINGERS IV SOLN
500.0000 mL | Freq: Once | INTRAVENOUS | Status: AC
  Administered 2020-08-23: 21:00:00 500 mL via INTRAVENOUS

## 2020-08-23 MED ORDER — LACTATED RINGERS IV SOLN
500.0000 mL | INTRAVENOUS | Status: DC | PRN
  Administered 2020-08-23 (×2): 250 mL via INTRAVENOUS

## 2020-08-23 MED ORDER — OXYCODONE-ACETAMINOPHEN 5-325 MG PO TABS
1.0000 | ORAL_TABLET | ORAL | Status: DC | PRN
Start: 2020-08-23 — End: 2020-08-24

## 2020-08-23 MED ORDER — PHENYLEPHRINE 40 MCG/ML (10ML) SYRINGE FOR IV PUSH (FOR BLOOD PRESSURE SUPPORT)
80.0000 ug | PREFILLED_SYRINGE | INTRAVENOUS | Status: DC | PRN
  Administered 2020-08-23: 21:00:00 80 ug via INTRAVENOUS

## 2020-08-23 MED ORDER — ACETAMINOPHEN 325 MG PO TABS: 650.0000 mg | ORAL_TABLET | ORAL | Status: DC | PRN

## 2020-08-23 MED ORDER — LACTATED RINGERS IV SOLN: 500.0000 mL | Freq: Once | INTRAVENOUS | Status: DC

## 2020-08-23 MED ORDER — ONDANSETRON HCL 4 MG/2ML IJ SOLN: 4.0000 mg | Freq: Four times a day (QID) | INTRAMUSCULAR | Status: DC | PRN

## 2020-08-23 MED ORDER — LACTATED RINGERS AMNIOINFUSION: INTRAVENOUS | Status: DC

## 2020-08-23 NOTE — Progress Notes (Signed)
Feeling some ctx Afeb, VSS FHT- 130s, Cat I, irreg ctx VE-2+/50/-2 to -3, vtx, AROM clear Continue pitocin, monitor progress, anticipate VBAC

## 2020-08-23 NOTE — Anesthesia Procedure Notes (Signed)
Epidural Patient location during procedure: OB Start time: 08/23/2020 8:51 PM End time: 08/23/2020 9:01 PM  Staffing Anesthesiologist: Achille Rich, MD Performed: anesthesiologist   Preanesthetic Checklist Completed: patient identified, IV checked, site marked, risks and benefits discussed, monitors and equipment checked, pre-op evaluation and timeout performed  Epidural Patient position: sitting Prep: DuraPrep Patient monitoring: heart rate, cardiac monitor, continuous pulse ox and blood pressure Approach: midline Location: L2-L3 Injection technique: LOR saline  Needle:  Needle type: Tuohy  Needle gauge: 17 G Needle length: 9 cm Needle insertion depth: 7 cm Catheter type: closed end flexible Catheter size: 19 Gauge Catheter at skin depth: 13 cm Test dose: negative and Other  Assessment Events: blood not aspirated, injection not painful, no injection resistance and negative IV test  Additional Notes Informed consent obtained prior to proceeding including risk of failure, 1% risk of PDPH, risk of minor discomfort and bruising.  Discussed rare but serious complications including epidural abscess, permanent nerve injury, epidural hematoma.  Discussed alternatives to epidural analgesia and patient desires to proceed.  Timeout performed pre-procedure verifying patient name, procedure, and platelet count.  Patient tolerated procedure well. Reason for block:procedure for pain

## 2020-08-23 NOTE — Anesthesia Preprocedure Evaluation (Signed)
Anesthesia Evaluation  Patient identified by MRN, date of birth, ID band Patient awake    Reviewed: Allergy & Precautions, H&P , NPO status , Patient's Chart, lab work & pertinent test results  Airway Mallampati: II   Neck ROM: full    Dental   Pulmonary neg pulmonary ROS,    breath sounds clear to auscultation       Cardiovascular negative cardio ROS   Rhythm:regular Rate:Normal     Neuro/Psych    GI/Hepatic   Endo/Other    Renal/GU      Musculoskeletal   Abdominal   Peds  Hematology   Anesthesia Other Findings   Reproductive/Obstetrics (+) Pregnancy                             Anesthesia Physical Anesthesia Plan  ASA: I  Anesthesia Plan: Epidural   Post-op Pain Management:    Induction: Intravenous  PONV Risk Score and Plan: 2 and Treatment may vary due to age or medical condition  Airway Management Planned: Natural Airway  Additional Equipment:   Intra-op Plan:   Post-operative Plan:   Informed Consent: I have reviewed the patients History and Physical, chart, labs and discussed the procedure including the risks, benefits and alternatives for the proposed anesthesia with the patient or authorized representative who has indicated his/her understanding and acceptance.       Plan Discussed with: Anesthesiologist  Anesthesia Plan Comments:         Anesthesia Quick Evaluation  

## 2020-08-23 NOTE — H&P (Signed)
Karen Blevins is a 29 y.o. female, G4 P60, EGA [redacted] weeks with [facility] 12-23 presenting for elective induction.  Previous LTCS followed by VBAC, wants VBAC again.  Hgb AS, fetus with bilateral CP cysts.  OB History    Gravida  4   Para  2   Term  2   Preterm      AB  1   Living  2     SAB      IAB  1   Ectopic      Multiple  0   Live Births  2          Past Medical History:  Diagnosis Date  . Medical history non-contributory    Past Surgical History:  Procedure Laterality Date  . CESAREAN SECTION  2011   Family History: family history is not on file. Social History:  reports that she has never smoked. She has never used smokeless tobacco. She reports that she does not drink alcohol and does not use drugs.     Maternal Diabetes: No Genetic Screening: Declined Maternal Ultrasounds/Referrals: Isolated choroid plexus cyst Fetal Ultrasounds or other Referrals:  None Maternal Substance Abuse:  No Significant Maternal Medications:  None Significant Maternal Lab Results:  Group B Strep negative Other Comments:  None  Review of Systems  Respiratory: Negative.   Cardiovascular: Negative.    Maternal Medical History:  Fetal activity: Perceived fetal activity is normal.    Prenatal complications: no prenatal complications Prenatal Complications - Diabetes: none.    Dilation: 1 Effacement (%): 50 Station: -3 Exam by:: [employer] RN Blood pressure 114/77, pulse 71, resp. rate 16, height 5' (1.524 m), weight 79.1 kg, last menstrual period 11/16/2019, unknown if currently breastfeeding. Maternal Exam:  Uterine Assessment: Contraction strength is mild.  Contraction frequency is irregular.   Abdomen: Patient reports no abdominal tenderness. Surgical scars: low transverse.   Estimated fetal weight is 7.5 lbs.   Fetal presentation: vertex  Introitus: Normal vulva. Normal vagina.  Amniotic fluid character: not assessed.  Pelvis: adequate for delivery.       Fetal Exam Fetal Monitor Review: Mode: ultrasound.   Baseline rate: 130.  Variability: moderate (6-25 bpm).   Pattern: accelerations present and no decelerations.    Fetal State Assessment: Category I - tracings are normal.     Physical Exam Vitals reviewed.  Constitutional:      Appearance: Normal appearance.  Cardiovascular:     Rate and Rhythm: Normal rate and regular rhythm.  Pulmonary:     Effort: Pulmonary effort is normal. No respiratory distress.  Abdominal:     Palpations: Abdomen is soft.  Genitourinary:    General: Normal vulva.  Neurological:     Mental Status: She is alert.     Prenatal labs: ABO, Rh: --/--/O POS (12/16 1038) Antibody: NEG (12/16 1038) Rubella:  immune RPR:   NR HBsAg:   neg HIV:   NR GBS:   neg  Assessment/Plan: IUP at 39 weeks, previous LTCS and VBAC, wants VBAC again, for elective induction.  Admitted, started on pitocin, will monitor progress, anticipate VBAC   Leighton Roach Savier Trickett 08/23/2020, 12:57 PM

## 2020-08-24 ENCOUNTER — Encounter (HOSPITAL_COMMUNITY): Payer: Self-pay | Admitting: Obstetrics and Gynecology

## 2020-08-24 LAB — RPR: RPR Ser Ql: NONREACTIVE

## 2020-08-24 MED ORDER — ONDANSETRON HCL 4 MG PO TABS: 4.0000 mg | ORAL_TABLET | ORAL | Status: DC | PRN

## 2020-08-24 MED ORDER — ACETAMINOPHEN 325 MG PO TABS: 650.0000 mg | ORAL_TABLET | ORAL | Status: DC | PRN

## 2020-08-24 MED ORDER — METHYLERGONOVINE MALEATE 0.2 MG/ML IJ SOLN: 0.2000 mg | INTRAMUSCULAR | Status: DC | PRN

## 2020-08-24 MED ORDER — MEASLES, MUMPS & RUBELLA VAC IJ SOLR: 0.5000 mL | Freq: Once | INTRAMUSCULAR | Status: DC

## 2020-08-24 MED ORDER — OXYCODONE HCL 5 MG PO TABS: 10.0000 mg | ORAL_TABLET | ORAL | Status: DC | PRN

## 2020-08-24 MED ORDER — SENNOSIDES-DOCUSATE SODIUM 8.6-50 MG PO TABS
2.0000 | ORAL_TABLET | Freq: Every day | ORAL | Status: DC
  Administered 2020-08-25: 12:00:00 2 via ORAL
  Filled 2020-08-24: qty 2

## 2020-08-24 MED ORDER — PRENATAL MULTIVITAMIN CH
1.0000 | ORAL_TABLET | Freq: Every day | ORAL | Status: DC
  Administered 2020-08-24 – 2020-08-25 (×2): 1 via ORAL
  Filled 2020-08-24 (×2): qty 1

## 2020-08-24 MED ORDER — COCONUT OIL OIL: 1.0000 "application " | TOPICAL_OIL | Status: DC | PRN

## 2020-08-24 MED ORDER — WITCH HAZEL-GLYCERIN EX PADS: 1.0000 "application " | MEDICATED_PAD | CUTANEOUS | Status: DC | PRN

## 2020-08-24 MED ORDER — ONDANSETRON HCL 4 MG/2ML IJ SOLN: 4.0000 mg | INTRAMUSCULAR | Status: DC | PRN

## 2020-08-24 MED ORDER — DIPHENHYDRAMINE HCL 25 MG PO CAPS: 25.0000 mg | ORAL_CAPSULE | Freq: Four times a day (QID) | ORAL | Status: DC | PRN

## 2020-08-24 MED ORDER — BENZOCAINE-MENTHOL 20-0.5 % EX AERO: 1.0000 "application " | INHALATION_SPRAY | CUTANEOUS | Status: DC | PRN

## 2020-08-24 MED ORDER — ZOLPIDEM TARTRATE 5 MG PO TABS: 5.0000 mg | ORAL_TABLET | Freq: Every evening | ORAL | Status: DC | PRN

## 2020-08-24 MED ORDER — OXYCODONE HCL 5 MG PO TABS: 5.0000 mg | ORAL_TABLET | ORAL | Status: DC | PRN

## 2020-08-24 MED ORDER — DIBUCAINE (PERIANAL) 1 % EX OINT: 1.0000 "application " | TOPICAL_OINTMENT | CUTANEOUS | Status: DC | PRN

## 2020-08-24 MED ORDER — TETANUS-DIPHTH-ACELL PERTUSSIS 5-2.5-18.5 LF-MCG/0.5 IM SUSY: 0.5000 mL | PREFILLED_SYRINGE | Freq: Once | INTRAMUSCULAR | Status: DC

## 2020-08-24 MED ORDER — METHYLERGONOVINE MALEATE 0.2 MG PO TABS: 0.2000 mg | ORAL_TABLET | ORAL | Status: DC | PRN

## 2020-08-24 MED ORDER — SIMETHICONE 80 MG PO CHEW: 80.0000 mg | CHEWABLE_TABLET | ORAL | Status: DC | PRN

## 2020-08-24 MED ORDER — MAGNESIUM HYDROXIDE 400 MG/5ML PO SUSP: 30.0000 mL | ORAL | Status: DC | PRN

## 2020-08-24 MED ORDER — IBUPROFEN 600 MG PO TABS
600.0000 mg | ORAL_TABLET | Freq: Four times a day (QID) | ORAL | Status: DC
  Administered 2020-08-24 – 2020-08-25 (×7): 600 mg via ORAL
  Filled 2020-08-24 (×7): qty 1

## 2020-08-24 NOTE — Anesthesia Postprocedure Evaluation (Signed)
Anesthesia Post Note  Patient: Karen Blevins  Procedure(s) Performed: AN AD HOC LABOR EPIDURAL     Patient location during evaluation: Mother Baby Anesthesia Type: Epidural Level of consciousness: awake and alert Pain management: pain level controlled Vital Signs Assessment: post-procedure vital signs reviewed and stable Respiratory status: spontaneous breathing, nonlabored ventilation and respiratory function stable Cardiovascular status: stable Postop Assessment: no headache, no backache and epidural receding Anesthetic complications: no   No complications documented.  Last Vitals:  Vitals:   08/24/20 0240 08/24/20 0345  BP: 107/72 101/69  Pulse: 74 78  Resp: 16 16  Temp: 37.1 C 37.4 C  SpO2:      Last Pain:  Vitals:   08/24/20 0600  TempSrc:   PainSc: 3    Pain Goal: Patients Stated Pain Goal: 5 (08/23/20 1800)                 Rica Records
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
## 2020-08-24 NOTE — Progress Notes (Signed)
POSTPARTUM PROGRESS NOTE  Post Partum Day #0  Subjective:  No acute events overnight.  Pt denies problems with ambulating, voiding or po intake.  She denies nausea or vomiting.  Pain is well controlled.  Lochia Minimal. Desires circ for baby boy, will be done tomorrow given time of delivery  Objective: Blood pressure 101/68, pulse 62, temperature 98.3 F (36.8 C), temperature source Oral, resp. rate 18, height 5' (1.524 m), weight 79.1 kg, last menstrual period 11/16/2019, SpO2 100 %, unknown if currently breastfeeding.  Physical Exam:  General: alert, cooperative and no distress Lochia:normal flow Chest: CTAB Heart: RRR no m/r/g Abdomen: +BS, soft, nontender Uterine Fundus: firm, 2cm below umbilicus GU: suture intact, healing well, no purulent drainage Extremities: neg edema, neg calf TTP BL, neg Homans BL  Recent Labs    08/23/20 1038  HGB 11.2*  HCT 34.7*    Assessment/Plan:  ASSESSMENT: Karen Blevins is a 29 y.o. [id number] s/p VA-VBAC for FHR decelerations @ [redacted]w[redacted]d. PNC c/b Hgb AS.   Plan for discharge tomorrow, Breastfeeding, Lactation consult and Circumcision prior to discharge   LOS: 1 day

## 2020-08-25 MED ORDER — IBUPROFEN 800 MG PO TABS: 800.0000 mg | ORAL_TABLET | Freq: Three times a day (TID) | ORAL | 1 refills | Status: AC | PRN

## 2020-08-25 NOTE — Progress Notes (Signed)
POSTPARTUM PROGRESS NOTE  Post Partum Day #1  Subjective:  No acute events overnight.  Pt denies problems with ambulating, voiding or po intake.  She denies nausea or vomiting.  Pain is well controlled.  Lochia Minimal. Baby boy s/p circ   Objective: Blood pressure 96/62, pulse 61, temperature 97.7 F (36.5 C), temperature source Oral, resp. rate 18, height 5' (1.524 m), weight 79.1 kg, last menstrual period 11/16/2019, SpO2 100 %, unknown if currently breastfeeding.  Physical Exam:  General: alert, cooperative and no distress Lochia:normal flow Chest: CTAB Heart: RRR no m/r/g Abdomen: +BS, soft, nontender Uterine Fundus: firm, 2cm below umbilicus GU: suture intact, healing well, no purulent drainage Extremities: neg edema, neg calf TTP BL, neg Homans BL  Recent Labs    08/23/20 1038  HGB 11.2*  HCT 34.7*    Assessment/Plan:  ASSESSMENT: Karen Blevins is a 29 y.o. [id number] s/p VA-VBAC for FHR decelerations @ [redacted]w[redacted]d. PNC c/b Hgb AS.   Discharge home, Breastfeeding and Lactation consult   LOS: 2 days

## 2020-08-25 NOTE — Discharge Summary (Signed)
Postpartum Discharge Summary  Date of Service updated     Patient Name: Karen Blevins DOB: 1991/03/28 MRN: [id number]  Date of admission: 08/23/2020 Delivery date:08/24/2020  Delivering provider: Jackelyn Knife, TODD  Date of discharge: 08/25/2020  Admitting diagnosis: Indication for care in labor or delivery [O75.9] Intrauterine pregnancy: [redacted]w[redacted]d     Secondary diagnosis:  Active Problems:   Indication for care in labor or delivery  Additional problems: h/o csx followed by VBAC; IPV via FOB    Discharge diagnosis: VBAC                                              Post partum procedures:none Augmentation: AROM and Pitocin Complications: None  Hospital course: Induction of Labor With Vaginal Delivery   29 y.o. yo [id number] at [redacted]w[redacted]d was admitted to the hospital 08/23/2020 for induction of labor.  Indication for induction: Favorable cervix at term.  Patient had an uncomplicated labor course as follows: Membrane Rupture Time/Date: 4:31 PM ,08/23/2020   Delivery Method:VBAC, Vacuum Assisted  Episiotomy: None  Lacerations:  Labial  Details of delivery can be found in separate delivery note.  Patient had a routine postpartum course. Patient is discharged home 08/25/20.  Newborn Data: Birth date:08/24/2020  Birth time:12:20 AM  Gender:Female  Living status:Living  Apgars:8 ,9  Weight:3260 g   Physical exam  Vitals:   08/24/20 1253 08/24/20 1821 08/24/20 2201 08/25/20 0627  BP: 99/66 107/74 104/72 96/62  Pulse: 84 68 72 61  Resp: 18 18 15 18   Temp: 98.8 F (37.1 C) 98.7 F (37.1 C) 98 F (36.7 C) 97.7 F (36.5 C)  TempSrc: Oral Oral Oral Oral  SpO2: 97% 100% 100% 100%  Weight:      Height:       General: alert, cooperative and no distress Lochia: appropriate Uterine Fundus: firm Incision: N/A DVT Evaluation: No evidence of DVT seen on physical exam. Negative Homan's sign. No cords or calf tenderness. Labs: Lab Results  Component Value Date   WBC 3.9 (L) 08/23/2020    HGB 11.2 (L) 08/23/2020   HCT 34.7 (L) 08/23/2020   MCV 88.5 08/23/2020   PLT 229 08/23/2020   No flowsheet data found. Edinburgh Score: Edinburgh Postnatal Depression Scale Screening Tool 08/24/2020  I have been able to laugh and see the funny side of things. 0  I have looked forward with enjoyment to things. 1  I have blamed myself unnecessarily when things went wrong. 2  I have been anxious or worried for no good reason. 2  I have felt scared or panicky for no good reason. 0  Things have been getting on top of me. 1  I have been so unhappy that I have had difficulty sleeping. 2  I have felt sad or miserable. 2  I have been so unhappy that I have been crying. 3  The thought of harming myself has occurred to me. 0  Edinburgh Postnatal Depression Scale Total 13      After visit meds:  Allergies as of 08/25/2020   No Known Allergies     Medication List    STOP taking these medications   ferrous sulfate 325 (65 FE) MG tablet   prochlorperazine 10 MG tablet Commonly known as: COMPAZINE     TAKE these medications   ibuprofen 800 MG tablet Commonly known as: ADVIL Take 1  tablet (800 mg total) by mouth every 8 (eight) hours as needed.   prenatal multivitamin Tabs tablet Take 1 tablet by mouth daily at 12 noon.        Discharge home in stable condition Infant Feeding: Bottle and Breast Infant Disposition:home with mother Discharge instruction: per After Visit Summary and Postpartum booklet. Activity: Advance as tolerated. Pelvic rest for 6 weeks.  Diet: routine diet Anticipated Birth Control: Unsure Postpartum Appointment:6 weeks Future Appointments:No future appointments. Follow up Visit:      08/25/2020 Carlisle Cater, MD
